# Patient Record
Sex: Female | Born: 1960 | Race: White | Hispanic: Yes | Smoking: Current every day smoker
Health system: Southern US, Community
[De-identification: ages and names within clinical notes are randomized; demographics above are authoritative.]

## PROBLEM LIST (undated history)

## (undated) DIAGNOSIS — F32A Depression, unspecified: Secondary | ICD-10-CM

## (undated) DIAGNOSIS — F329 Major depressive disorder, single episode, unspecified: Secondary | ICD-10-CM

## (undated) DIAGNOSIS — M549 Dorsalgia, unspecified: Secondary | ICD-10-CM

## (undated) DIAGNOSIS — E119 Type 2 diabetes mellitus without complications: Secondary | ICD-10-CM

## (undated) DIAGNOSIS — F419 Anxiety disorder, unspecified: Secondary | ICD-10-CM

## (undated) HISTORY — DX: Dorsalgia, unspecified: M54.9

## (undated) HISTORY — DX: Anxiety disorder, unspecified: F41.9

## (undated) HISTORY — DX: Depression, unspecified: F32.A

## (undated) HISTORY — PX: BLADDER REPAIR: SHX6721

## (undated) HISTORY — PX: NECK SURGERY: SHX720

## (undated) HISTORY — PX: BACK SURGERY: SHX140

## (undated) HISTORY — PX: APPENDECTOMY: SHX54

## (undated) HISTORY — PX: BREAST SURGERY: SHX581

---
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
## 2009-01-05 ENCOUNTER — Ambulatory Visit: Payer: Self-pay | Admitting: Family Medicine

## 2009-05-06 ENCOUNTER — Observation Stay: Payer: Self-pay | Admitting: Internal Medicine

## 2009-07-22 ENCOUNTER — Ambulatory Visit: Payer: Self-pay | Admitting: Neurosurgery

## 2009-09-18 ENCOUNTER — Ambulatory Visit: Payer: Self-pay | Admitting: Pain Medicine

## 2009-11-02 ENCOUNTER — Ambulatory Visit: Payer: Self-pay | Admitting: Pain Medicine

## 2009-11-15 ENCOUNTER — Ambulatory Visit: Payer: Self-pay | Admitting: Pain Medicine

## 2009-11-28 ENCOUNTER — Emergency Department: Payer: Self-pay | Admitting: Emergency Medicine

## 2009-11-28 ENCOUNTER — Ambulatory Visit: Payer: Self-pay | Admitting: Pain Medicine

## 2009-12-27 ENCOUNTER — Ambulatory Visit: Payer: Self-pay | Admitting: Pain Medicine

## 2010-01-02 ENCOUNTER — Ambulatory Visit: Payer: Self-pay | Admitting: Pain Medicine

## 2010-01-18 ENCOUNTER — Ambulatory Visit: Payer: Self-pay | Admitting: Pain Medicine

## 2010-02-01 ENCOUNTER — Ambulatory Visit: Payer: Self-pay | Admitting: Pain Medicine

## 2010-03-09 ENCOUNTER — Ambulatory Visit: Payer: Self-pay | Admitting: Pain Medicine

## 2010-04-26 ENCOUNTER — Observation Stay: Payer: Self-pay | Admitting: Internal Medicine

## 2010-10-16 IMAGING — NM NM MYOCARD GATED
2 series · 12 of 12 positions shown · non-contrast
Comparison: none

REASON FOR EXAM: chest pain
COMMENTS:

PROCEDURE:     NM  - NM GATED MYOCARDIAL ST SCAN [DATE]OF[DATE]  - [DATE] [DATE] [DATE] [DATE]
RESULT:
HISTORY: 49-year-old female with progressive chest pain consistent with
possible myocardial ischemia. The patient underwent a Bruce protocol with an
exercise time of 5 minutes 16 seconds achieving 7 METS, 87% MPHR with a peak
heart rate of 148 beats per minute. Peak systolic blood pressure 164/45,
baseline 82; 106/70. The patient had no chest pain. Baseline EKG shows
normal sinus rhythm with normal EKG. Peak stress shows no
electrocardiographic changes or arrhythmia. The patient received 13.56 mCi
of Sestamibi at rest and 31.38 mCi of Sestamibi peak stress intravenously in
the left antecubital.

[Series 1000: rest mibi · 6.59mm/px · 6 of 64 frames shown]
[frame 6/64]
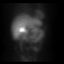
[frame 16/64]
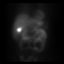
[frame 27/64]
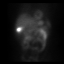
[frame 38/64]
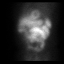
[frame 48/64]
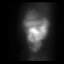
[frame 59/64]
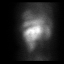

[Series 1000: rest mibi (corrected) · 6.59mm/px · 6 of 64 frames shown]
[frame 6/64]
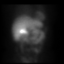
[frame 16/64]
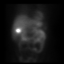
[frame 27/64]
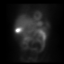
[frame 38/64]
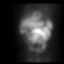
[frame 48/64]
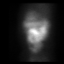
[frame 59/64]
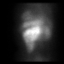

[12 of 12 positions shown; findings below may reference images not displayed]

IMAGE ANALYSIS: Normal LV systolic function with ejection fraction of 70%.
All myocardial walls have normal myocardial function and wall motion. Rest
and peak stress myocardial perfusion images were compared showing normal
myocardial perfusion at rest and peak stress without evidence of myocardial
ischemia.
IMPRESSION: 1.Normal treadmill EKG without evidence of ischemia or arrhythmia.

2.Average exercise tolerance for age.

3.Normal LV systolic function with ejection fraction of 70%.

## 2019-05-24 ENCOUNTER — Other Ambulatory Visit: Payer: Self-pay

## 2019-05-24 ENCOUNTER — Encounter (HOSPITAL_COMMUNITY): Payer: Self-pay | Admitting: Psychiatry

## 2019-05-24 ENCOUNTER — Other Ambulatory Visit (HOSPITAL_COMMUNITY): Payer: Medicare Other | Attending: Psychiatry | Admitting: Psychiatry

## 2019-05-24 ENCOUNTER — Telehealth (HOSPITAL_COMMUNITY): Payer: Self-pay | Admitting: Psychiatry

## 2019-05-24 NOTE — Telephone Encounter (Signed)
D:  Pt referred by Jonelle Sidle (referral coordinator) on 05-21-19; but writer was on PAL.  A:  Placed call to orient patient.  Pt will start MH-IOP tomorrow @ 9 a.m.  Inform Jonelle Sidle.  R:  Pt receptive.
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
## 2019-05-27 ENCOUNTER — Telehealth (HOSPITAL_COMMUNITY): Payer: Self-pay | Admitting: Psychiatry

## 2019-06-04 ENCOUNTER — Encounter: Payer: Self-pay | Admitting: Emergency Medicine

## 2019-06-04 ENCOUNTER — Other Ambulatory Visit: Payer: Self-pay

## 2019-06-04 ENCOUNTER — Emergency Department
Admission: EM | Admit: 2019-06-04 | Discharge: 2019-06-04 | Disposition: A | Discharge status: Home / Self Care | Payer: Medicare Other | Attending: Student in an Organized Health Care Education/Training Program | Admitting: Student in an Organized Health Care Education/Training Program

## 2019-06-04 DIAGNOSIS — E1165 Type 2 diabetes mellitus with hyperglycemia: Secondary | ICD-10-CM | POA: Insufficient documentation

## 2019-06-04 DIAGNOSIS — F17210 Nicotine dependence, cigarettes, uncomplicated: Secondary | ICD-10-CM | POA: Diagnosis not present

## 2019-06-04 DIAGNOSIS — Z7984 Long term (current) use of oral hypoglycemic drugs: Secondary | ICD-10-CM | POA: Insufficient documentation

## 2019-06-04 DIAGNOSIS — Z760 Encounter for issue of repeat prescription: Secondary | ICD-10-CM

## 2019-06-04 DIAGNOSIS — R739 Hyperglycemia, unspecified: Secondary | ICD-10-CM

## 2019-06-04 HISTORY — DX: Type 2 diabetes mellitus without complications: E11.9

## 2019-06-04 LAB — URINALYSIS, COMPLETE (UACMP) WITH MICROSCOPIC
Bacteria, UA: NONE SEEN
Bilirubin Urine: NEGATIVE
Glucose, UA: >=500 mg/dL — AB
Ketones, ur: 20 mg/dL — AB
Nitrite: NEGATIVE
Protein, ur: 30 mg/dL — AB
Specific Gravity, Urine: 1.027 (ref 1.005–1.030)
pH: 5 (ref 5.0–8.0)

## 2019-06-04 LAB — CBC
HCT: 49.7 % — ABNORMAL HIGH (ref 36.0–46.0)
Hemoglobin: 16.1 g/dL — ABNORMAL HIGH (ref 12.0–15.0)
MCH: 26.8 pg (ref 26.0–34.0)
MCHC: 32.4 g/dL (ref 30.0–36.0)
MCV: 82.7 fL (ref 80.0–100.0)
Platelets: 262 10*3/uL (ref 150–400)
RBC: 6.01 MIL/uL — ABNORMAL HIGH (ref 3.87–5.11)
RDW: 13.8 % (ref 11.5–15.5)
WBC: 11.8 10*3/uL — ABNORMAL HIGH (ref 4.0–10.5)
nRBC: 0 % (ref 0.0–0.2)

## 2019-06-04 LAB — BASIC METABOLIC PANEL
Anion gap: 12 (ref 5–15)
BUN: 12 mg/dL (ref 6–20)
CO2: 26 mmol/L (ref 22–32)
Calcium: 9.9 mg/dL (ref 8.9–10.3)
Chloride: 96 mmol/L — ABNORMAL LOW (ref 98–111)
Creatinine, Ser: 0.72 mg/dL (ref 0.44–1.00)
GFR calc Af Amer: >60 mL/min (ref >60)
GFR calc non Af Amer: >60 mL/min (ref >60)
Glucose, Bld: 308 mg/dL — ABNORMAL HIGH (ref 70–99)
Potassium: 4.9 mmol/L (ref 3.5–5.1)
Sodium: 134 mmol/L — ABNORMAL LOW (ref 135–145)

## 2019-06-04 LAB — GLUCOSE, CAPILLARY
Glucose-Capillary: 315 mg/dL — ABNORMAL HIGH (ref 70–99)
Glucose-Capillary: 223 mg/dL — ABNORMAL HIGH (ref 70–99)

## 2019-06-04 MED ORDER — SODIUM CHLORIDE 0.9 % IV BOLUS
1000.0000 mL | Freq: Once | INTRAVENOUS | Status: AC
  Administered 2019-06-04: 14:00:00 1000 mL via INTRAVENOUS

## 2019-06-04 MED ORDER — QUETIAPINE FUMARATE 300 MG PO TABS: 300.0000 mg | ORAL_TABLET | Freq: Every day | ORAL | 0 refills | Status: DC

## 2019-06-04 MED ORDER — CEPHALEXIN 500 MG PO CAPS: 500.0000 mg | ORAL_CAPSULE | Freq: Two times a day (BID) | ORAL | 0 refills | Status: AC

## 2019-06-04 NOTE — ED Triage Notes (Signed)
Pt to ED via POV c/o high blood sugar x 1 week. Pt states that she has been taking her PO diabetic medication but her sugar is still running high. Pt states that this morning her CBG was 418. Pt is in NAD.

## 2019-06-04 NOTE — Discharge Instructions (Signed)
Please follow-up with the primary care provider as scheduled.  Take the antibiotic until finished.  Return to the emergency department for symptoms of concern if you are unable to see her primary care provider sooner.

## 2019-06-04 NOTE — ED Provider Notes (Signed)
[facility] Emergency Department Provider Note ____________________________________________   First MD Initiated Contact with Patient 06/04/19 1302     (approximate)  I have reviewed the triage vital signs and the nursing notes.   HISTORY  Chief Complaint Hyperglycemia  HPI Beatriz ChancellorFrancisca Mackowski is a 58 y.o. female who presents to the emergency department for treatment and evaluation of hyperglycemia.  Patient states that she has taken her Januvia and has been compliant with her diabetic diet, however over the past week or so she has been unable to control her blood sugar.  She denies any symptoms of concern.  She does state that she recently moved to the area and her first appointment with primary care is June 17, 2019.  She is out of several of her medications, but not out of the Januvia.  She denies any nausea, vomiting, diarrhea, fever, headache, or other symptoms of concern.        Past Medical History:  Diagnosis Date  . Anxiety   . Depression   . Diabetes mellitus without complication (HCC)     There are no active problems to display for this patient.   History reviewed. No pertinent surgical history.  Prior to Admission medications   Medication Sig Start Date End Date Taking? Authorizing Provider  cephALEXin (KEFLEX) 500 MG capsule Take 1 capsule (500 mg total) by mouth 2 (two) times daily for 7 days. 06/04/19 06/11/19  Kenslei Hearty, Rulon Eisenmengerari B, FNP  QUEtiapine (SEROQUEL) 300 MG tablet Take 1 tablet (300 mg total) by mouth at bedtime. 06/04/19   Chinita Pesterriplett, Miria Cappelli B, FNP    Allergies Patient has no known allergies.  Family History  Problem Relation Age of Onset  . Depression Sister     Social History Social History   Tobacco Use  . Smoking status: Current Every Day Smoker    Packs/day: 0.50    Types: Cigarettes  . Smokeless tobacco: Never Used  Substance Use Topics  . Alcohol use: Not Currently  . Drug use: Yes    Frequency: 4.0 times per week     Types: Marijuana    Comment: "to help relax or for pain."    Review of Systems  Constitutional: No fever/chills Eyes: No visual changes. ENT: No sore throat. Cardiovascular: Denies chest pain. Respiratory: Denies shortness of breath. Gastrointestinal: No abdominal pain.  No nausea, no vomiting.  No diarrhea.  No constipation. Genitourinary: Negative for dysuria. Musculoskeletal: Negative for back pain. Skin: Negative for rash. Neurological: Negative for headaches, focal weakness or numbness. Endocrine:  Positive for polyuria and polydipsia ____________________________________________   PHYSICAL EXAM:  VITAL SIGNS: ED Triage Vitals  Enc Vitals Group     BP 06/04/19 1147 139/81     Pulse Rate 06/04/19 1147 89     Resp 06/04/19 1147 16     Temp 06/04/19 1147 98.4 F (36.9 C)     Temp Source 06/04/19 1147 Oral     SpO2 06/04/19 1147 100 %     Weight 06/04/19 1144 140 lb (63.5 kg)     Height 06/04/19 1144 5\' 1"  (1.549 m)     Head Circumference --      Peak Flow --      Pain Score 06/04/19 1144 0     Pain Loc --      Pain Edu? --      Excl. in GC? --     Constitutional: Alert and oriented. Well appearing and in no acute distress. Eyes: Conjunctivae are normal. PERRL.  EOMI. Head: Atraumatic. Nose: No congestion/rhinnorhea. Mouth/Throat: Mucous membranes are moist.  Oropharynx non-erythematous. Neck: No stridor.   Hematological/Lymphatic/Immunilogical: No cervical lymphadenopathy. Cardiovascular: Normal rate, regular rhythm. Grossly normal heart sounds.  Good peripheral circulation. Respiratory: Normal respiratory effort.  No retractions. Lungs CTAB. Gastrointestinal: Soft and nontender. No distention. No abdominal bruits. No CVA tenderness. Genitourinary:  Musculoskeletal: No lower extremity tenderness nor edema.  No joint effusions. Neurologic:  Normal speech and language. No gross focal neurologic deficits are appreciated. No gait instability. Skin:  Skin is  warm, dry and intact. No rash noted. Psychiatric: Mood and affect are normal. Speech and behavior are normal.  ____________________________________________   LABS (all labs ordered are listed, but only abnormal results are displayed)  Labs Reviewed  BASIC METABOLIC PANEL - Abnormal; Notable for the following components:      Result Value   Sodium 134 (*)    Chloride 96 (*)    Glucose, Bld 308 (*)    All other components within normal limits  CBC - Abnormal; Notable for the following components:   WBC 11.8 (*)    RBC 6.01 (*)    Hemoglobin 16.1 (*)    HCT 49.7 (*)    All other components within normal limits  URINALYSIS, COMPLETE (UACMP) WITH MICROSCOPIC - Abnormal; Notable for the following components:   Color, Urine YELLOW (*)    APPearance HAZY (*)    Glucose, UA >=500 (*)    Hgb urine dipstick SMALL (*)    Ketones, ur 20 (*)    Protein, ur 30 (*)    Leukocytes,Ua SMALL (*)    All other components within normal limits  GLUCOSE, CAPILLARY - Abnormal; Notable for the following components:   Glucose-Capillary 315 (*)    All other components within normal limits  GLUCOSE, CAPILLARY - Abnormal; Notable for the following components:   Glucose-Capillary 223 (*)    All other components within normal limits  CBG MONITORING, ED  CBG MONITORING, ED  CBG MONITORING, ED  CBG MONITORING, ED  CBG MONITORING, ED  CBG MONITORING, ED  CBG MONITORING, ED  CBG MONITORING, ED  CBG MONITORING, ED  CBG MONITORING, ED  CBG MONITORING, ED  CBG MONITORING, ED  CBG MONITORING, ED  CBG MONITORING, ED   ____________________________________________  EKG  Not indicated ____________________________________________  RADIOLOGY  ED MD interpretation: Not indicated  Official radiology report(s): No results found.  ____________________________________________   PROCEDURES  Procedure(s) performed (including Critical  Care):  Procedures   ____________________________________________   INITIAL IMPRESSION / ASSESSMENT AND PLAN   As part of my medical decision making, I reviewed the following data within the electronic MEDICAL RECORD NUMBER Labs reviewed      58 year old female presenting to the emergency department for assistance in regulating her glucose.  She does seem to be compliant with her medications.  Plan will be to ensure that there is no underlying infection or other obvious cause for hyperglycemia.  She denies any symptoms consistent with the COVID-19 pandemic.  She denies any known exposures.   DIFFERENTIAL DIAGNOSIS  Hyperglycemia, DKA  ED COURSE  While in the emergency department, the patient was given 1 L of IV fluids with successful reduction of her blood sugar down to 223.  Urinalysis does appear to have significant amount of white blood cells and small amount of leukocytes.  She is aware that she will need to keep her appointment scheduled for the 13th.  She is also aware that she will need to continue her  diabetic diet and take her Januvia.  Patient requested that she have a prescription for Seroquel.  This is 1 of the medications that she has been out of for the past few days and states that she is not getting any sleep at night.  Plan will be to give her prescription for 14 days.  She was aware that the emergency department does not routinely refill medications, however because she seems compliant with her medications and does have an appointment scheduled for the 13th feels reasonable to give her 2 weeks.  She states that she just got her Januvia refilled and does not need another at this time.    ____________________________________________   FINAL CLINICAL IMPRESSION(S) / ED DIAGNOSES  Final diagnoses:  Hyperglycemia  Medication refill     ED Discharge Orders         Ordered    cephALEXin (KEFLEX) 500 MG capsule  2 times daily     06/04/19 1453    QUEtiapine (SEROQUEL) 300  MG tablet  Daily at bedtime     06/04/19 1453           Note:  This document was prepared using Dragon voice recognition software and may include unintentional dictation errors.   Victorino Dike, FNP 06/04/19 1455    Merlyn Lot, MD 06/04/19 [phone]

## 2019-06-08 ENCOUNTER — Encounter: Payer: Self-pay | Admitting: Emergency Medicine

## 2019-06-08 ENCOUNTER — Other Ambulatory Visit: Payer: Self-pay

## 2019-06-08 ENCOUNTER — Emergency Department
Admission: EM | Admit: 2019-06-08 | Discharge: 2019-06-08 | Disposition: A | Discharge status: Home / Self Care | Payer: Medicare Other | Attending: Emergency Medicine | Admitting: Emergency Medicine

## 2019-06-08 DIAGNOSIS — F17210 Nicotine dependence, cigarettes, uncomplicated: Secondary | ICD-10-CM | POA: Insufficient documentation

## 2019-06-08 DIAGNOSIS — R739 Hyperglycemia, unspecified: Secondary | ICD-10-CM

## 2019-06-08 DIAGNOSIS — E1165 Type 2 diabetes mellitus with hyperglycemia: Secondary | ICD-10-CM | POA: Insufficient documentation

## 2019-06-08 LAB — URINALYSIS, COMPLETE (UACMP) WITH MICROSCOPIC
Bacteria, UA: NONE SEEN
Bilirubin Urine: NEGATIVE
Glucose, UA: >=500 mg/dL — AB
Ketones, ur: 5 mg/dL — AB
Leukocytes,Ua: NEGATIVE
Nitrite: NEGATIVE
Protein, ur: NEGATIVE mg/dL
Specific Gravity, Urine: 1.025 (ref 1.005–1.030)
pH: 6 (ref 5.0–8.0)

## 2019-06-08 LAB — BASIC METABOLIC PANEL
Anion gap: 11 (ref 5–15)
BUN: 17 mg/dL (ref 6–20)
CO2: 27 mmol/L (ref 22–32)
Calcium: 9.5 mg/dL (ref 8.9–10.3)
Chloride: 95 mmol/L — ABNORMAL LOW (ref 98–111)
Creatinine, Ser: 1.06 mg/dL — ABNORMAL HIGH (ref 0.44–1.00)
GFR calc Af Amer: >60 mL/min (ref >60)
GFR calc non Af Amer: 57 mL/min — ABNORMAL LOW (ref >60)
Glucose, Bld: 414 mg/dL — ABNORMAL HIGH (ref 70–99)
Potassium: 4.5 mmol/L (ref 3.5–5.1)
Sodium: 133 mmol/L — ABNORMAL LOW (ref 135–145)

## 2019-06-08 LAB — GLUCOSE, CAPILLARY
Glucose-Capillary: 401 mg/dL — ABNORMAL HIGH (ref 70–99)
Glucose-Capillary: 244 mg/dL — ABNORMAL HIGH (ref 70–99)

## 2019-06-08 LAB — CBC
HCT: 45.5 % (ref 36.0–46.0)
Hemoglobin: 14.6 g/dL (ref 12.0–15.0)
MCH: 26.8 pg (ref 26.0–34.0)
MCHC: 32.1 g/dL (ref 30.0–36.0)
MCV: 83.6 fL (ref 80.0–100.0)
Platelets: 248 10*3/uL (ref 150–400)
RBC: 5.44 MIL/uL — ABNORMAL HIGH (ref 3.87–5.11)
RDW: 13.8 % (ref 11.5–15.5)
WBC: 10.6 10*3/uL — ABNORMAL HIGH (ref 4.0–10.5)
nRBC: 0 % (ref 0.0–0.2)

## 2019-06-08 MED ORDER — SODIUM CHLORIDE 0.9 % IV BOLUS: 1000.0000 mL | Freq: Once | INTRAVENOUS | Status: DC

## 2019-06-08 NOTE — ED Provider Notes (Signed)
[facility] Emergency Department Provider Note  ____________________________________________   First MD Initiated Contact with Patient 06/08/19 1629     (approximate)  I have reviewed the triage vital signs and the nursing notes.   HISTORY  Chief Complaint Hyperglycemia    HPI Peggy Massey is a 58 y.o. female with diabetes who presents with hyperglycemia.    Patient was seen on 7/31 for hyperglycemia as well.  Patient was taking her Januvia is been compliant with her diabetic diet.  She recently moved to the area and her primary care appointment is on June 17, 2019 Patient's glucose was noted to be in the 300s and was given 1 L of fluids with brought her sugar down to 223.  Patient returns because her sugars have been in the 300s to 400s.  She has no symptoms.  She hypoglycemia has been intermittent, nothing makes it better or worse, onset a few days ago.    Past Medical History:  Diagnosis Date  . Anxiety   . Depression   . Diabetes mellitus without complication (HCC)     There are no active problems to display for this patient.   History reviewed. No pertinent surgical history.  Prior to Admission medications   Medication Sig Start Date End Date Taking? Authorizing Provider  cephALEXin (KEFLEX) 500 MG capsule Take 1 capsule (500 mg total) by mouth 2 (two) times daily for 7 days. 06/04/19 06/11/19  Triplett, Rulon Eisenmengerari B, FNP  QUEtiapine (SEROQUEL) 300 MG tablet Take 1 tablet (300 mg total) by mouth at bedtime. 06/04/19   Chinita Pesterriplett, Cari B, FNP    Allergies Patient has no known allergies.  Family History  Problem Relation Age of Onset  . Depression Sister     Social History Social History   Tobacco Use  . Smoking status: Current Every Day Smoker    Packs/day: 0.50    Types: Cigarettes  . Smokeless tobacco: Never Used  Substance Use Topics  . Alcohol use: Not Currently  . Drug use: Yes    Frequency: 4.0 times per week    Types:  Marijuana    Comment: "to help relax or for pain."      Review of Systems Constitutional: No fever/chills, positive for high sugars Eyes: No visual changes. ENT: No sore throat. Cardiovascular: Denies chest pain. Respiratory: Denies shortness of breath. Gastrointestinal: No abdominal pain.  No nausea, no vomiting.  No diarrhea.  No constipation. Genitourinary: Negative for dysuria. Musculoskeletal: Negative for back pain. Skin: Negative for rash. Neurological: Negative for headaches, focal weakness or numbness. All other ROS negative ____________________________________________   PHYSICAL EXAM:  VITAL SIGNS: ED Triage Vitals  Enc Vitals Group     BP 06/08/19 1604 130/69     Pulse Rate 06/08/19 1604 94     Resp 06/08/19 1604 16     Temp 06/08/19 1604 99.1 F (37.3 C)     Temp Source 06/08/19 1604 Oral     SpO2 06/08/19 1604 96 %     Weight 06/08/19 1602 139 lb 15.9 oz (63.5 kg)     Height --      Head Circumference --      Peak Flow --      Pain Score 06/08/19 1602 0     Pain Loc --      Pain Edu? --      Excl. in GC? --     Constitutional: Alert and oriented. Well appearing and in no acute distress. Eyes: Conjunctivae are  normal. EOMI. Head: Atraumatic. Nose: No congestion/rhinnorhea. Mouth/Throat: Mucous membranes are moist.   Neck: No stridor. Trachea Midline. FROM Cardiovascular: Normal rate, regular rhythm. Grossly normal heart sounds.  Good peripheral circulation. Respiratory: Normal respiratory effort.  No retractions. Lungs CTAB. Gastrointestinal: Soft and nontender. No distention. No abdominal bruits.  Musculoskeletal: No lower extremity tenderness nor edema.  No joint effusions. Neurologic:  Normal speech and language. No gross focal neurologic deficits are appreciated.  Skin:  Skin is warm, dry and intact. No rash noted. Psychiatric: Mood and affect are normal. Speech and behavior are normal. GU: Deferred    ____________________________________________   LABS (all labs ordered are listed, but only abnormal results are displayed)  Labs Reviewed  BASIC METABOLIC PANEL - Abnormal; Notable for the following components:      Result Value   Sodium 133 (*)    Chloride 95 (*)    Glucose, Bld 414 (*)    Creatinine, Ser 1.06 (*)    GFR calc non Af Amer 57 (*)    All other components within normal limits  CBC - Abnormal; Notable for the following components:   WBC 10.6 (*)    RBC 5.44 (*)    All other components within normal limits  URINALYSIS, COMPLETE (UACMP) WITH MICROSCOPIC - Abnormal; Notable for the following components:   Color, Urine YELLOW (*)    APPearance CLEAR (*)    Glucose, UA >=500 (*)    Hgb urine dipstick SMALL (*)    Ketones, ur 5 (*)    All other components within normal limits  GLUCOSE, CAPILLARY - Abnormal; Notable for the following components:   Glucose-Capillary 401 (*)    All other components within normal limits  GLUCOSE, CAPILLARY - Abnormal; Notable for the following components:   Glucose-Capillary 244 (*)    All other components within normal limits  CBG MONITORING, ED  CBG MONITORING, ED   ____________________________________________   PROCEDURES  Procedure(s) performed (including Critical Care):  Procedures   ____________________________________________   INITIAL IMPRESSION / ASSESSMENT AND PLAN / ED COURSE  Peggy Massey was evaluated in Emergency Department on 06/08/2019 for the symptoms described in the history of present illness. She was evaluated in the context of the global COVID-19 pandemic, which necessitated consideration that the patient might be at risk for infection with the SARS-CoV-2 virus that causes COVID-19. Institutional protocols and algorithms that pertain to the evaluation of patients at risk for COVID-19 are in a state of rapid change based on information released by regulatory bodies including the CDC and federal and state  organizations. These policies and algorithms were followed during the patient's care in the ED.    Patient is a well-appearing 58 year old who presents with hyperglycemia from known type 2 diabetes.  Patient is already on medication.  She is not able to take metformin.  Patient will most likely need be started on another agent versus being started on insulin.  I discussed with patient that sugars in the 300s to 400s although not ideal as long she is not having symptoms to suggest DKA that this can be safely followed up outpatient with her primary care doctor.  However given that she is now bounced back to the emergency department with her high sugars I did offer admission overnight to be started on insulin.  Patient would prefer to go home and follow-up outpatient.  She has no symptoms to suggest DKA.  No symptoms to suggest UTI, pneumonia.  Her abdomen is soft and nontender.  UA with 5 ketones in it.  Glucose elevated at 401.  Creatinine is slightly elevated at 1.06 without anion gap elevation.  White count is slightly downtrending from 4 days ago at 10.6.   Patient was given 2 L of fluid and repeat sugar was 244.  Patient continues to be asymptomatic.  Again discussed with patient about admission versus discharge home and she would prefer to go home.  Patient will call her PCP to try to get earlier follow-up otherwise she has follow-up within the week.  I discussed the provisional nature of ED diagnosis, the treatment so far, the ongoing plan of care, follow up appointments and return precautions with the patient and any family or support people present. They expressed understanding and agreed with the plan, discharged home.       ____________________________________________   FINAL CLINICAL IMPRESSION(S) / ED DIAGNOSES   Final diagnoses:  Hyperglycemia      MEDICATIONS GIVEN DURING THIS VISIT:  Medications  sodium chloride 0.9 % bolus 1,000 mL (1,000 mLs Intravenous New Bag/Given  06/08/19 1721)  sodium chloride 0.9 % bolus 1,000 mL (1,000 mLs Intravenous New Bag/Given 06/08/19 1706)     ED Discharge Orders    None       Note:  This document was prepared using Dragon voice recognition software and may include unintentional dictation errors.   Concha SeFunke, Chantilly Linskey E, MD 06/08/19 2011

## 2019-06-08 NOTE — Discharge Instructions (Signed)
Your sugar was elevated consistent with your type 2 diabetes.  You should call your doctor tomorrow to get an earlier visit .however if you develop symptoms of DKA which are vomiting, abdominal pain, weakness, that you should return immediately to the ER.

## 2019-06-08 NOTE — ED Notes (Signed)
Pt c/o hyperglycemia. Pt says she is taking Januvia. Pt c/o of heaviness head pain and slightly weak.

## 2019-06-08 NOTE — ED Triage Notes (Signed)
C/O hyperglycemia.  STates since being seen through ED last week, CBG running 400-500.  Patient is AAOx3.  Skin warm and dry. NAD

## 2019-06-17 ENCOUNTER — Other Ambulatory Visit: Payer: Self-pay

## 2019-06-17 ENCOUNTER — Other Ambulatory Visit: Payer: Self-pay | Admitting: Family Medicine

## 2019-06-17 ENCOUNTER — Ambulatory Visit (INDEPENDENT_AMBULATORY_CARE_PROVIDER_SITE_OTHER): Payer: Medicare Other | Admitting: Family Medicine

## 2019-06-17 ENCOUNTER — Encounter: Payer: Self-pay | Admitting: Family Medicine

## 2019-06-17 VITALS — BP 124/72 | HR 89 | Temp 97.1°F | Resp 14 | Ht 61.0 in | Wt 136.1 lb

## 2019-06-17 DIAGNOSIS — Z1159 Encounter for screening for other viral diseases: Secondary | ICD-10-CM

## 2019-06-17 DIAGNOSIS — Z114 Encounter for screening for human immunodeficiency virus [HIV]: Secondary | ICD-10-CM

## 2019-06-17 DIAGNOSIS — Z862 Personal history of diseases of the blood and blood-forming organs and certain disorders involving the immune mechanism: Secondary | ICD-10-CM

## 2019-06-17 DIAGNOSIS — E119 Type 2 diabetes mellitus without complications: Secondary | ICD-10-CM | POA: Diagnosis not present

## 2019-06-17 DIAGNOSIS — F319 Bipolar disorder, unspecified: Secondary | ICD-10-CM

## 2019-06-17 DIAGNOSIS — R5383 Other fatigue: Secondary | ICD-10-CM

## 2019-06-17 DIAGNOSIS — Z1231 Encounter for screening mammogram for malignant neoplasm of breast: Secondary | ICD-10-CM

## 2019-06-17 DIAGNOSIS — F419 Anxiety disorder, unspecified: Secondary | ICD-10-CM | POA: Diagnosis not present

## 2019-06-17 DIAGNOSIS — M549 Dorsalgia, unspecified: Secondary | ICD-10-CM

## 2019-06-17 DIAGNOSIS — F32A Depression, unspecified: Secondary | ICD-10-CM | POA: Insufficient documentation

## 2019-06-17 DIAGNOSIS — Z23 Encounter for immunization: Secondary | ICD-10-CM | POA: Diagnosis not present

## 2019-06-17 DIAGNOSIS — F329 Major depressive disorder, single episode, unspecified: Secondary | ICD-10-CM | POA: Insufficient documentation

## 2019-06-17 DIAGNOSIS — E559 Vitamin D deficiency, unspecified: Secondary | ICD-10-CM

## 2019-06-17 DIAGNOSIS — Z8742 Personal history of other diseases of the female genital tract: Secondary | ICD-10-CM

## 2019-06-17 DIAGNOSIS — G8929 Other chronic pain: Secondary | ICD-10-CM

## 2019-06-17 DIAGNOSIS — Z13220 Encounter for screening for lipoid disorders: Secondary | ICD-10-CM

## 2019-06-17 DIAGNOSIS — Z8639 Personal history of other endocrine, nutritional and metabolic disease: Secondary | ICD-10-CM | POA: Insufficient documentation

## 2019-06-17 LAB — POCT GLYCOSYLATED HEMOGLOBIN (HGB A1C): HbA1c, POC (controlled diabetic range): 13 % — AB (ref 0.0–7.0)

## 2019-06-17 MED ORDER — TOUJEO MAX SOLOSTAR 300 UNIT/ML ~~LOC~~ SOPN: 12.0000 [IU] | PEN_INJECTOR | Freq: Every day | SUBCUTANEOUS | 0 refills | Status: DC

## 2019-06-17 MED ORDER — QUETIAPINE FUMARATE 300 MG PO TABS: 300.0000 mg | ORAL_TABLET | Freq: Every day | ORAL | 1 refills | Status: DC

## 2019-06-17 MED ORDER — BLOOD GLUCOSE MONITOR KIT: PACK | 0 refills | Status: AC

## 2019-06-17 MED ORDER — HYDROXYZINE HCL 50 MG PO TABS: 50.0000 mg | ORAL_TABLET | Freq: Three times a day (TID) | ORAL | 1 refills | Status: DC | PRN

## 2019-06-17 MED ORDER — TRAZODONE HCL 50 MG PO TABS: ORAL_TABLET | ORAL | 1 refills | Status: DC

## 2019-06-17 MED ORDER — OZEMPIC (0.25 OR 0.5 MG/DOSE) 2 MG/1.5ML ~~LOC~~ SOPN: PEN_INJECTOR | SUBCUTANEOUS | 3 refills | Status: DC

## 2019-06-17 MED ORDER — NOVOFINE 32G X 6 MM MISC: 1.0000 | Freq: Every day | 3 refills | Status: AC

## 2019-06-17 MED ORDER — OMEPRAZOLE 40 MG PO CPDR: 40.0000 mg | DELAYED_RELEASE_CAPSULE | Freq: Every day | ORAL | 1 refills | Status: AC

## 2019-06-17 MED ORDER — DULOXETINE HCL 30 MG PO CPEP: ORAL_CAPSULE | ORAL | 1 refills | Status: DC

## 2019-06-17 NOTE — Patient Instructions (Signed)
Goal Blood Sugar is 100-140 (fasting, first thing in the morning before eating) STOP Januvia.  Toujeo: Days 1, 2, & 3: Take 12 units once daily at bedtime If blood sugar is still >140, increase to 14 units for 3 days. If blood sugar is still >140, increase to 16 units for 3 days. IF blood sugar is still >140, increase to 18 units for 3 days If blood sugar is still >140, increase to 20 units for 3 days.  Ozempic: Inject 0.25mg  once a week for 4 weeks. Then increase to 0.5mg  once a week.
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
## 2019-06-18 LAB — COMPLETE METABOLIC PANEL WITH GFR
AG Ratio: 1.5 (calc) (ref 1.0–2.5)
ALT: 28 U/L (ref 6–29)
AST: 18 U/L (ref 10–35)
Albumin: 4.9 g/dL (ref 3.6–5.1)
Alkaline phosphatase (APISO): 140 U/L (ref 37–153)
BUN: 13 mg/dL (ref 7–25)
CO2: 29 mmol/L (ref 20–32)
Calcium: 10.7 mg/dL — ABNORMAL HIGH (ref 8.6–10.4)
Chloride: 95 mmol/L — ABNORMAL LOW (ref 98–110)
Creat: 0.7 mg/dL (ref 0.50–1.05)
GFR, Est African American: 110 mL/min/{1.73_m2} (ref >=60)
GFR, Est Non African American: 95 mL/min/{1.73_m2} (ref >=60)
Globulin: 3.2 g/dL (calc) (ref 1.9–3.7)
Glucose, Bld: 341 mg/dL — ABNORMAL HIGH (ref 65–99)
Potassium: 4.8 mmol/L (ref 3.5–5.3)
Sodium: 135 mmol/L (ref 135–146)
Total Bilirubin: 0.4 mg/dL (ref 0.2–1.2)
Total Protein: 8.1 g/dL (ref 6.1–8.1)

## 2019-06-18 LAB — CBC WITH DIFFERENTIAL/PLATELET
Absolute Monocytes: 491 cells/uL (ref 200–950)
Basophils Absolute: 64 cells/uL (ref 0–200)
Basophils Relative: 0.7 %
Eosinophils Absolute: 118 cells/uL (ref 15–500)
Eosinophils Relative: 1.3 %
HCT: 48.2 % — ABNORMAL HIGH (ref 35.0–45.0)
Hemoglobin: 15.4 g/dL (ref 11.7–15.5)
Lymphs Abs: 3331 cells/uL (ref 850–3900)
MCH: 26.6 pg — ABNORMAL LOW (ref 27.0–33.0)
MCHC: 32 g/dL (ref 32.0–36.0)
MCV: 83.2 fL (ref 80.0–100.0)
MPV: 11.6 fL (ref 7.5–12.5)
Monocytes Relative: 5.4 %
Neutro Abs: 5096 cells/uL (ref 1500–7800)
Neutrophils Relative %: 56 %
Platelets: 269 10*3/uL (ref 140–400)
RBC: 5.79 10*6/uL — ABNORMAL HIGH (ref 3.80–5.10)
RDW: 13 % (ref 11.0–15.0)
Total Lymphocyte: 36.6 %
WBC: 9.1 10*3/uL (ref 3.8–10.8)

## 2019-06-18 LAB — MICROALBUMIN / CREATININE URINE RATIO
Creatinine, Urine: 46 mg/dL (ref 20–275)
Microalb Creat Ratio: 54 mcg/mg creat — ABNORMAL HIGH (ref <30)
Microalb, Ur: 2.5 mg/dL

## 2019-06-18 LAB — TSH: TSH: 1.08 mIU/L (ref 0.40–4.50)

## 2019-06-18 LAB — LIPID PANEL
Cholesterol: 259 mg/dL — ABNORMAL HIGH (ref <200)
HDL: 38 mg/dL — ABNORMAL LOW (ref >=50)
LDL Cholesterol (Calc): 159 mg/dL (calc) — ABNORMAL HIGH
Non-HDL Cholesterol (Calc): 221 mg/dL (calc) — ABNORMAL HIGH (ref <130)
Total CHOL/HDL Ratio: 6.8 (calc) — ABNORMAL HIGH (ref <5.0)
Triglycerides: 364 mg/dL — ABNORMAL HIGH (ref <150)

## 2019-06-18 LAB — HIV ANTIBODY (ROUTINE TESTING W REFLEX): HIV 1&2 Ab, 4th Generation: NONREACTIVE

## 2019-06-18 LAB — HEPATITIS C ANTIBODY
Hepatitis C Ab: NONREACTIVE
SIGNAL TO CUT-OFF: 0.01 (ref <1.00)

## 2019-06-18 LAB — VITAMIN D 25 HYDROXY (VIT D DEFICIENCY, FRACTURES): Vit D, 25-Hydroxy: 26 ng/mL — ABNORMAL LOW (ref 30–100)

## 2019-06-22 ENCOUNTER — Other Ambulatory Visit: Payer: Self-pay | Admitting: Family Medicine

## 2019-06-22 DIAGNOSIS — E1169 Type 2 diabetes mellitus with other specified complication: Secondary | ICD-10-CM

## 2019-06-22 DIAGNOSIS — E1129 Type 2 diabetes mellitus with other diabetic kidney complication: Secondary | ICD-10-CM | POA: Insufficient documentation

## 2019-06-22 DIAGNOSIS — E785 Hyperlipidemia, unspecified: Secondary | ICD-10-CM | POA: Insufficient documentation

## 2019-06-22 DIAGNOSIS — Z794 Long term (current) use of insulin: Secondary | ICD-10-CM

## 2019-06-22 MED ORDER — ATORVASTATIN CALCIUM 40 MG PO TABS: 40.0000 mg | ORAL_TABLET | Freq: Every day | ORAL | 3 refills | Status: AC

## 2019-06-22 MED ORDER — ICOSAPENT ETHYL 1 G PO CAPS: 2.0000 | ORAL_CAPSULE | Freq: Two times a day (BID) | ORAL | 1 refills | Status: AC

## 2019-06-22 MED ORDER — LISINOPRIL 5 MG PO TABS: 5.0000 mg | ORAL_TABLET | Freq: Every day | ORAL | 3 refills | Status: AC

## 2019-06-23 ENCOUNTER — Telehealth: Payer: Self-pay | Admitting: Family Medicine

## 2019-06-23 DIAGNOSIS — R112 Nausea with vomiting, unspecified: Secondary | ICD-10-CM

## 2019-06-23 NOTE — Telephone Encounter (Signed)
Pt called stating the insulin she was prescribed is making her vomit. Pt is requesting an alternate medication be sent in. Please advise.   [id number] [address]  [place] [address]  Phone: [phone] Fax: [phone]  Not a 24 hour pharmacy; exact hours not known.

## 2019-06-24 ENCOUNTER — Encounter: Payer: Self-pay | Admitting: Obstetrics and Gynecology

## 2019-06-24 ENCOUNTER — Other Ambulatory Visit: Payer: Self-pay

## 2019-06-24 ENCOUNTER — Ambulatory Visit (INDEPENDENT_AMBULATORY_CARE_PROVIDER_SITE_OTHER): Payer: Medicare Other | Admitting: Obstetrics and Gynecology

## 2019-06-24 VITALS — BP 118/76 | HR 86 | Ht 61.0 in | Wt 137.4 lb

## 2019-06-24 DIAGNOSIS — R87619 Unspecified abnormal cytological findings in specimens from cervix uteri: Secondary | ICD-10-CM

## 2019-06-24 MED ORDER — ONDANSETRON HCL 4 MG PO TABS: 4.0000 mg | ORAL_TABLET | Freq: Three times a day (TID) | ORAL | 0 refills | Status: DC | PRN

## 2019-06-24 NOTE — Telephone Encounter (Signed)
Patient notified

## 2019-06-24 NOTE — Telephone Encounter (Signed)
No alternative.  Please have her STOP the Ozempic injection for 14 days to see if this improves her symptoms.  Continue with the Toujeo (insulin).  I am sending in some zofran to help her nausea.  If things worsen, she will need to be seen.

## 2019-06-24 NOTE — Progress Notes (Signed)
HPI:      [name] is a 58 y.o. No obstetric history on file. who LMP was No LMP recorded. Patient is postmenopausal.  Subjective:   She presents today as a referral from [facility].  She is a relatively poor historian but does state that she had an abnormal Pap smear and underwent cryotherapy of her cervix for this problem.  She denies previous colposcopy.  She states that she never had follow-up after this procedure.  She reports no problems at this time. (We have no records from these visits in [place])    Hx: The following portions of the patient's history were reviewed and updated as appropriate:             She  has a past medical history of Anxiety, Back ache, Depression, and Diabetes mellitus without complication ([place]). She does not have any pertinent problems on file. She  has a past surgical history that includes Back surgery; Bladder repair; Appendectomy; Breast surgery; Neck surgery; and Cesarean section. Her family history includes Depression in her sister. She  reports that she has been smoking cigarettes. She has been smoking about 0.50 packs per day. She has never used smokeless tobacco. She reports previous alcohol use. She reports current drug use. Frequency: 4.00 times per week. Drug: Marijuana. She has a current medication list which includes the following prescription(s): accu-chek fastclix lancets, atorvastatin, blood glucose meter kit and supplies, duloxetine, hydroxyzine, toujeo max solostar, novofine, omeprazole, quetiapine, trazodone, icosapent ethyl, lisinopril, and ondansetron. She has No Known Allergies.       Review of Systems:  Review of Systems  Constitutional: Denied constitutional symptoms, night sweats, recent illness, fatigue, fever, insomnia and weight loss.  Eyes: Denied eye symptoms, eye pain, photophobia, vision change and visual disturbance.  Ears/Nose/Throat/Neck: Denied ear, nose, throat or neck symptoms, hearing loss, nasal  discharge, sinus congestion and sore throat.  Cardiovascular: Denied cardiovascular symptoms, arrhythmia, chest pain/pressure, edema, exercise intolerance, orthopnea and palpitations.  Respiratory: Denied pulmonary symptoms, asthma, pleuritic pain, productive sputum, cough, dyspnea and wheezing.  Gastrointestinal: Denied, gastro-esophageal reflux, melena, nausea and vomiting.  Genitourinary: See HPI for additional information.  Musculoskeletal: Denied musculoskeletal symptoms, stiffness, swelling, muscle weakness and myalgia.  Dermatologic: Denied dermatology symptoms, rash and scar.  Neurologic: Denied neurology symptoms, dizziness, headache, neck pain and syncope.  Psychiatric: Denied psychiatric symptoms, anxiety and depression.  Endocrine: Denied endocrine symptoms including hot flashes and night sweats.   Meds:   Current Outpatient Medications on File Prior to Visit  Medication Sig Dispense Refill  . Accu-Chek FastClix Lancets MISC USE UP TO FOUR TIMES DAILY AS DIRECTED 306 each 3  . atorvastatin (LIPITOR) 40 MG tablet Take 1 tablet (40 mg total) by mouth daily. **Note dose change** 90 tablet 3  . blood glucose meter kit and supplies KIT Dispense based on patient and insurance preference. Use up to four times daily as directed. (FOR ICD-9 250.00, 250.01). 1 each 0  . DULoxetine (CYMBALTA) 30 MG capsule TK ONE C PO QD 30 capsule 1  . hydrOXYzine (ATARAX/VISTARIL) 50 MG tablet Take 1 tablet (50 mg total) by mouth 3 (three) times daily as needed. for anxiety 30 tablet 1  . Insulin Glargine, 2 Unit Dial, (TOUJEO MAX SOLOSTAR) 300 UNIT/ML SOPN Inject 12-20 Units into the skin at bedtime. 9 mL 0  . Insulin Pen Needle (NOVOFINE) 32G X 6 MM MISC 1 each by Does not apply route daily. 100 each 3  . omeprazole (PRILOSEC) 40 MG capsule Take  1 capsule (40 mg total) by mouth daily. 90 capsule 1  . QUEtiapine (SEROQUEL) 300 MG tablet Take 1 tablet (300 mg total) by mouth at bedtime. 30 tablet 1  .  traZODone (DESYREL) 50 MG tablet TK 1 T PO  QHS 30 tablet 1  . Icosapent Ethyl 1 g CAPS Take 2 capsules (2 g total) by mouth 2 (two) times daily. (Patient not taking: Reported on 06/24/2019) 360 capsule 1  . lisinopril (ZESTRIL) 5 MG tablet Take 1 tablet (5 mg total) by mouth daily. (Patient not taking: Reported on 06/24/2019) 90 tablet 3  . ondansetron (ZOFRAN) 4 MG tablet Take 1 tablet (4 mg total) by mouth every 8 (eight) hours as needed for nausea or vomiting. (Patient not taking: Reported on 06/24/2019) 20 tablet 0   No current facility-administered medications on file prior to visit.     Objective:     Vitals:   06/24/19 1431  BP: 118/76  Pulse: 86              Physical examination   Pelvic:   Vulva: Normal appearance.  No lesions.  Vagina: No lesions or abnormalities noted.  Support: Normal pelvic support.  Urethra No masses tenderness or scarring.  Meatus Normal size without lesions or prolapse.  Cervix:  Very difficult to identify appears flush with vaginal vault  Anus: Normal exam.  No lesions.  Perineum: Normal exam.  No lesions.     Assessment:    No obstetric history on file. Patient Active Problem List   Diagnosis Date Noted  . Hyperlipidemia associated with type 2 diabetes mellitus ([place]) 06/22/2019  . Microalbuminuria due to type 2 diabetes mellitus ([place]) 06/22/2019  . History of anemia 06/17/2019  . History of vitamin D deficiency 06/17/2019  . Fatigue 06/17/2019  . Bipolar disorder ([place]) 06/17/2019  . Chronic bilateral back pain 06/17/2019  . History of abnormal cervical Papanicolaou smear 06/17/2019  . Diabetes mellitus without complication ([place])   . Depression   . Anxiety      1. 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  I find it unusual that she underwent cryo for an abnormal Pap smear without prior colposcopy but patient seems adamant colposcopy did not  occur.   Plan:            1.  As I have no idea why she had cryo I believe a Pap smear of the upper vagina/cervix can be used to decide if further diagnostic procedures are necessary.  We will wait for results before determining future management. Orders No orders of the defined types were placed in this encounter.   No orders of the defined types were placed in this encounter.     F/U  Return for We will contact her with any abnormal test results. I spent 22 minutes involved in the care of this patient of which greater than 50% was spent discussing previous history of cervical issues, possible diagnoses based on patient's description of treatment, necessity of follow-up and usefulness of Pap smears.  All questions answered.  Finis Bud, M.D. 06/24/2019 4:25 PM

## 2019-06-25 ENCOUNTER — Other Ambulatory Visit (HOSPITAL_COMMUNITY)
Admission: RE | Admit: 2019-06-25 | Discharge: 2019-06-25 | Disposition: A | Discharge status: Home / Self Care | Payer: Medicare Other | Source: Ambulatory Visit | Attending: Obstetrics and Gynecology | Admitting: Obstetrics and Gynecology

## 2019-06-25 DIAGNOSIS — R87619 Unspecified abnormal cytological findings in specimens from cervix uteri: Secondary | ICD-10-CM | POA: Insufficient documentation

## 2019-06-25 DIAGNOSIS — Z1151 Encounter for screening for human papillomavirus (HPV): Secondary | ICD-10-CM | POA: Diagnosis not present

## 2019-06-25 NOTE — Addendum Note (Signed)
Addended by: Durwin Glaze on: 06/25/2019 10:51 AM   Modules accepted: Orders

## 2019-06-29 LAB — CYTOLOGY - PAP
Adequacy: ABSENT
Diagnosis: NEGATIVE
HPV: NOT DETECTED

## 2019-08-09 ENCOUNTER — Other Ambulatory Visit: Payer: Self-pay

## 2019-08-09 ENCOUNTER — Encounter: Payer: Self-pay | Admitting: Family Medicine

## 2019-08-09 ENCOUNTER — Ambulatory Visit (INDEPENDENT_AMBULATORY_CARE_PROVIDER_SITE_OTHER): Payer: Medicare Other | Admitting: Family Medicine

## 2019-08-09 ENCOUNTER — Telehealth: Payer: Self-pay | Admitting: Family Medicine

## 2019-08-09 VITALS — BP 110/72 | HR 92 | Temp 97.9°F | Resp 16 | Ht 61.0 in | Wt 138.3 lb

## 2019-08-09 DIAGNOSIS — F319 Bipolar disorder, unspecified: Secondary | ICD-10-CM | POA: Diagnosis not present

## 2019-08-09 DIAGNOSIS — Z23 Encounter for immunization: Secondary | ICD-10-CM

## 2019-08-09 DIAGNOSIS — E1129 Type 2 diabetes mellitus with other diabetic kidney complication: Secondary | ICD-10-CM

## 2019-08-09 DIAGNOSIS — R809 Proteinuria, unspecified: Secondary | ICD-10-CM | POA: Diagnosis not present

## 2019-08-09 DIAGNOSIS — L2082 Flexural eczema: Secondary | ICD-10-CM | POA: Diagnosis not present

## 2019-08-09 DIAGNOSIS — Z794 Long term (current) use of insulin: Secondary | ICD-10-CM

## 2019-08-09 LAB — POCT CBG (FASTING - GLUCOSE)-MANUAL ENTRY: Glucose Fasting, POC: 229 mg/dL — AB (ref 70–99)

## 2019-08-09 MED ORDER — DULOXETINE HCL 30 MG PO CPEP: ORAL_CAPSULE | ORAL | 1 refills | Status: AC

## 2019-08-09 MED ORDER — HYDROXYZINE HCL 50 MG PO TABS: 50.0000 mg | ORAL_TABLET | Freq: Three times a day (TID) | ORAL | 1 refills | Status: AC | PRN

## 2019-08-09 MED ORDER — TRAZODONE HCL 50 MG PO TABS: ORAL_TABLET | ORAL | 1 refills | Status: DC

## 2019-08-09 MED ORDER — TRIAMCINOLONE ACETONIDE 0.025 % EX CREA: 1.0000 "application " | TOPICAL_CREAM | Freq: Two times a day (BID) | CUTANEOUS | 1 refills | Status: DC

## 2019-08-09 MED ORDER — QUETIAPINE FUMARATE 300 MG PO TABS: 300.0000 mg | ORAL_TABLET | Freq: Every day | ORAL | 1 refills | Status: DC

## 2019-08-09 NOTE — Telephone Encounter (Signed)
That's perfect thank you 

## 2019-08-09 NOTE — Telephone Encounter (Signed)
General 07/26/2019 8:17 AM [facility] - -  Note   Called patient three times,with no return call to schedule. Also reached out three times by email with no response  Thank you for the referral Page

## 2019-08-09 NOTE — Telephone Encounter (Signed)
Can you please provide the contact information to the patient so she can reach out to them?

## 2019-08-09 NOTE — Telephone Encounter (Signed)
I can but I already switched it to:  Peggy Massey

## 2019-08-09 NOTE — Progress Notes (Signed)
Name: Peggy Massey   MRN: [id number]    DOB: 12-23-1960   Date:08/09/2019       Progress Note  Subjective  Chief Complaint  Chief Complaint  Patient presents with  . Follow-up  . Diabetes    HPI  Pt presents for 6 week DM follow up.  Her A1C was 13% when she established care with our clinic 06/17/2019.  She was started on Toujeo and advised to titrate this based on home BG's, and on Weekly Ozempic.  Today she reports BG's have been 88-120 at home.  She is on 20 units Toujeo at this time. She denies hypoglycemic episodes, no polyuria, polydipsia, or polyphagia. We will maintain current regimen.  Bipolar: Was referred to psychiatry, we maintained her medications for 60 days to allow time to be seen.  She has not heard back yet, so we will provide an additional 30 days.  She denies depressive or manic symptoms, feels her mood has been very stable on current regimen.  We will have referrals coordinator check in with psychiatry referral today.  LEFT posterior neck itchiness ongoing intermittently for a few months.  She does have some other areas on the flexural aspect of the bilataeral elbows.  Patient Active Problem List   Diagnosis Date Noted  . Hyperlipidemia associated with type 2 diabetes mellitus ([place]) 06/22/2019  . Microalbuminuria due to type 2 diabetes mellitus ([place]) 06/22/2019  . History of anemia 06/17/2019  . History of vitamin D deficiency 06/17/2019  . Fatigue 06/17/2019  . Bipolar disorder ([place]) 06/17/2019  . Chronic bilateral back pain 06/17/2019  . History of abnormal cervical Papanicolaou smear 06/17/2019  . Diabetes mellitus without complication ([place])   . Depression   . Anxiety     Past Surgical History:  Procedure Laterality Date  . APPENDECTOMY    . BACK SURGERY    . BLADDER REPAIR    . BREAST SURGERY    . CESAREAN SECTION    . NECK SURGERY      Family History  Problem Relation Age of Onset  . Depression Sister     Social History    Socioeconomic History  . Marital status: Divorced    Spouse name: Not on file  . Number of children: 4  . Years of education: Not on file  . Highest education level: 9th grade  Occupational History  . Occupation: disabled  Social Needs  . Financial resource strain: Not hard at all  . Food insecurity    Worry: Never true    Inability: Never true  . Transportation needs    Medical: No    Non-medical: No  Tobacco Use  . Smoking status: Current Every Day Smoker    Packs/day: 0.50    Types: Cigarettes  . Smokeless tobacco: Never Used  Substance and Sexual Activity  . Alcohol use: Not Currently  . Drug use: Yes    Frequency: 4.0 times per week    Types: Marijuana    Comment: "to help relax or for pain."  . Sexual activity: Not Currently  Lifestyle  . Physical activity    Days per week: 0 days    Minutes per session: 0 min  . Stress: Rather much  Relationships  . Social connections    Talks on phone: More than three times a week    Gets together: Never    Attends religious service: Never    Active member of club or organization: No    Attends meetings of clubs or  organizations: Never    Relationship status: Divorced  . Intimate partner violence    Fear of current or ex partner: No    Emotionally abused: No    Physically abused: No    Forced sexual activity: No  Other Topics Concern  . Not on file  Social History Narrative  . Not on file     Current Outpatient Medications:  .  Accu-Chek FastClix Lancets MISC, USE UP TO FOUR TIMES DAILY AS DIRECTED, Disp: 306 each, Rfl: 3 .  atorvastatin (LIPITOR) 40 MG tablet, Take 1 tablet (40 mg total) by mouth daily. **Note dose change**, Disp: 90 tablet, Rfl: 3 .  blood glucose meter kit and supplies KIT, Dispense based on patient and insurance preference. Use up to four times daily as directed. (FOR ICD-9 250.00, 250.01)., Disp: 1 each, Rfl: 0 .  DULoxetine (CYMBALTA) 30 MG capsule, TK ONE C PO QD, Disp: 30 capsule, Rfl: 1 .   hydrOXYzine (ATARAX/VISTARIL) 50 MG tablet, Take 1 tablet (50 mg total) by mouth 3 (three) times daily as needed. for anxiety, Disp: 30 tablet, Rfl: 1 .  Insulin Glargine, 2 Unit Dial, (TOUJEO MAX SOLOSTAR) 300 UNIT/ML SOPN, Inject 12-20 Units into the skin at bedtime., Disp: 9 mL, Rfl: 0 .  Insulin Pen Needle (NOVOFINE) 32G X 6 MM MISC, 1 each by Does not apply route daily., Disp: 100 each, Rfl: 3 .  omeprazole (PRILOSEC) 40 MG capsule, Take 1 capsule (40 mg total) by mouth daily., Disp: 90 capsule, Rfl: 1 .  QUEtiapine (SEROQUEL) 300 MG tablet, Take 1 tablet (300 mg total) by mouth at bedtime., Disp: 30 tablet, Rfl: 1 .  traZODone (DESYREL) 50 MG tablet, TK 1 T PO  QHS, Disp: 30 tablet, Rfl: 1 .  Icosapent Ethyl 1 g CAPS, Take 2 capsules (2 g total) by mouth 2 (two) times daily. (Patient not taking: Reported on 06/24/2019), Disp: 360 capsule, Rfl: 1 .  lisinopril (ZESTRIL) 5 MG tablet, Take 1 tablet (5 mg total) by mouth daily. (Patient not taking: Reported on 06/24/2019), Disp: 90 tablet, Rfl: 3 .  ondansetron (ZOFRAN) 4 MG tablet, Take 1 tablet (4 mg total) by mouth every 8 (eight) hours as needed for nausea or vomiting. (Patient not taking: Reported on 06/24/2019), Disp: 20 tablet, Rfl: 0 .  triamcinolone (KENALOG) 0.025 % cream, Apply 1 application topically 2 (two) times daily., Disp: 30 g, Rfl: 1  No Known Allergies  I personally reviewed active problem list, medication list, allergies, health maintenance, notes from last encounter, lab results with the patient/caregiver today.   ROS Constitutional: Negative for fever or weight change.  Respiratory: Negative for cough and shortness of breath.   Cardiovascular: Negative for chest pain or palpitations.  Gastrointestinal: Negative for abdominal pain, no bowel changes.  Musculoskeletal: Negative for gait problem or joint swelling.  Skin: Negative for rash.  Neurological: Negative for dizziness or headache.  No other specific complaints in a  complete review of systems (except as listed in HPI above).  Objective  Vitals:   08/09/19 1112  BP: 110/72  Pulse: 92  Resp: 16  Temp: 97.9 F (36.6 C)  TempSrc: Temporal  SpO2: 93%  Weight: 138 lb 4.8 oz (62.7 kg)  Height: 5' 1"  (1.549 m)    Body mass index is 26.13 kg/m.  Physical Exam  Constitutional: Patient appears well-developed and well-nourished. No distress.  HENT: Head: Normocephalic and atraumatic. Eyes: Conjunctivae and EOM are normal. No scleral icterus.   Neck: Normal range  of motion. Neck supple. No JVD present.  Cardiovascular: Normal rate, regular rhythm and normal heart sounds.  No murmur heard. No BLE edema. Pulmonary/Chest: Effort normal and breath sounds normal. No respiratory distress. Musculoskeletal: Normal range of motion, no joint effusions. No gross deformities Neurological: Pt is alert and oriented to person, place, and time. No cranial nerve deficit. Coordination, balance, strength, speech and gait are normal.  Skin: Skin is warm and dry. No rash noted. No erythema.  Psychiatric: Patient has a normal mood and affect. behavior is normal. Judgment and thought content normal.  Results for orders placed or performed in visit on 08/09/19 (from the past 72 hour(s))  POCT CBG (Fasting - Glucose)     Status: Abnormal   Collection Time: 08/09/19 11:40 AM  Result Value Ref Range   Glucose Fasting, POC 229 (A) 70 - 99 mg/dL    PHQ2/9: Depression screen [facility] 2/9 08/09/2019 06/17/2019  Decreased Interest 0 3  Down, Depressed, Hopeless 0 3  PHQ - 2 Score 0 6  Altered sleeping 0 3  Tired, decreased energy 0 3  Change in appetite 0 3  Feeling bad or failure about yourself  0 3  Trouble concentrating 0 3  Moving slowly or fidgety/restless 0 0  Suicidal thoughts 0 0  PHQ-9 Score 0 21  Difficult doing work/chores Not difficult at all Very difficult   PHQ-2/9 Result is negative.    Fall Risk: Fall Risk  08/09/2019 06/17/2019  Falls in the past year? 0 0   Number falls in past yr: 0 0  Injury with Fall? 0 0  Follow up Falls evaluation completed Falls evaluation completed   Assessment & Plan  1. Type 2 diabetes mellitus with microalbuminuria, with long-term current use of insulin ([facility]) - Maintain current dosing of 20units daily of Toujeo; Ozempic weekly.   2. Bipolar affective disorder, remission status unspecified ([facility]) - hydrOXYzine (ATARAX/VISTARIL) 50 MG tablet; Take 1 tablet (50 mg total) by mouth 3 (three) times daily as needed. for anxiety  Dispense: 30 tablet; Refill: 1 - QUEtiapine (SEROQUEL) 300 MG tablet; Take 1 tablet (300 mg total) by mouth at bedtime.  Dispense: 30 tablet; Refill: 1 - DULoxetine (CYMBALTA) 30 MG capsule; TK ONE C PO QD  Dispense: 30 capsule; Refill: 1 - traZODone (DESYREL) 50 MG tablet; TK 1 T PO  QHS  Dispense: 30 tablet; Refill: 1  3. Flexural eczema - triamcinolone (KENALOG) 0.025 % cream; Apply 1 application topically 2 (two) times daily.  Dispense: 30 g; Refill: 1

## 2019-08-09 NOTE — Telephone Encounter (Signed)
Peggy Massey - please check on psychiatry referral for this patient, she has not heard back yet.  Please also send to different pain specialist as Dr. Consuela Mimes is not taking new patients at this time.

## 2019-09-03 ENCOUNTER — Telehealth: Payer: Self-pay

## 2019-09-03 NOTE — Telephone Encounter (Signed)
1. Pain Management - she was never referred to a physician that was accepting new patients - Dr. Consuela Mimes not taking new patients.  Please send to alternative specialist if possible. 2. Psychaitry - I believe she wanted to switch - can we send to alternative from prior [facility]?   Thanks!

## 2019-09-03 NOTE — Telephone Encounter (Signed)
Copied from [place] [id number]. Topic: Referral - Status >> Sep 03, 2019 11:37 AM Scherrie Gerlach wrote: Reason for CRM: pt states she has not heard anything from a 1 pain management referral 2. Psychiatric referral  Please look at referral notes and advise on how you want to proceed

## 2019-09-06 NOTE — Telephone Encounter (Signed)
Referral for pain has been sent to [place] and [place].  Referral for psy has been sent to Hildred Priest, MD

## 2019-09-09 ENCOUNTER — Telehealth: Payer: Self-pay

## 2019-09-09 NOTE — Telephone Encounter (Signed)
Copied from [place] [id number]. Topic: Referral - Status >> Sep 09, 2019  9:47 AM Berneta Levins wrote: Reason for CRM:   Mel Almond from [facility] calling.  States they received a referral for this pt - but they do not accept her insurance.  Mel Almond can be reached at [phone]   I checked eligibility on [facility] site and it states that she is covered at the referred to location but when I called to speak to [facility] there was no answer.  A message was left for her to give me a call back so I can get clarity of why this patient cannot be seen there.

## 2019-09-14 ENCOUNTER — Telehealth: Payer: Self-pay | Admitting: Family Medicine

## 2019-09-14 ENCOUNTER — Ambulatory Visit (INDEPENDENT_AMBULATORY_CARE_PROVIDER_SITE_OTHER): Payer: Medicare Other | Admitting: Family Medicine

## 2019-09-14 ENCOUNTER — Other Ambulatory Visit: Payer: Self-pay

## 2019-09-14 ENCOUNTER — Encounter: Payer: Self-pay | Admitting: Family Medicine

## 2019-09-14 VITALS — BP 100/64 | HR 60 | Temp 97.6°F | Resp 14 | Ht 61.0 in | Wt 142.8 lb

## 2019-09-14 DIAGNOSIS — E1169 Type 2 diabetes mellitus with other specified complication: Secondary | ICD-10-CM

## 2019-09-14 DIAGNOSIS — Z0000 Encounter for general adult medical examination without abnormal findings: Secondary | ICD-10-CM

## 2019-09-14 DIAGNOSIS — E119 Type 2 diabetes mellitus without complications: Secondary | ICD-10-CM | POA: Diagnosis not present

## 2019-09-14 DIAGNOSIS — L309 Dermatitis, unspecified: Secondary | ICD-10-CM

## 2019-09-14 DIAGNOSIS — Z23 Encounter for immunization: Secondary | ICD-10-CM

## 2019-09-14 DIAGNOSIS — Z808 Family history of malignant neoplasm of other organs or systems: Secondary | ICD-10-CM

## 2019-09-14 DIAGNOSIS — E785 Hyperlipidemia, unspecified: Secondary | ICD-10-CM

## 2019-09-14 DIAGNOSIS — Z122 Encounter for screening for malignant neoplasm of respiratory organs: Secondary | ICD-10-CM

## 2019-09-14 MED ORDER — TOUJEO MAX SOLOSTAR 300 UNIT/ML ~~LOC~~ SOPN: 12.0000 [IU] | PEN_INJECTOR | Freq: Every day | SUBCUTANEOUS | 3 refills | Status: AC

## 2019-09-14 MED ORDER — OZEMPIC (0.25 OR 0.5 MG/DOSE) 2 MG/1.5ML ~~LOC~~ SOPN: 0.5000 mg | PEN_INJECTOR | SUBCUTANEOUS | Status: AC

## 2019-09-14 MED ORDER — CLOBETASOL PROPIONATE 0.05 % EX SOLN: 1.0000 "application " | Freq: Two times a day (BID) | CUTANEOUS | 0 refills | Status: DC | PRN

## 2019-09-14 NOTE — Telephone Encounter (Signed)
I've changed some things on the referral and will resend it out today but she can call them at [phone]

## 2019-09-14 NOTE — Progress Notes (Signed)
Name: Peggy Massey   MRN: [id number]    DOB: 1961-05-20   Date:09/14/2019       Progress Note  Subjective  Chief Complaint  Chief Complaint  Patient presents with  . Annual Exam    HPI  Patient presents for annual CPE.  Diet: Eating plenty of fresh vegetable; avoiding sugary foods and beverages, but does eat some potatoes. Exercise: Unable to exercise secondary to her chronic back pain; we will check on referral to pain management today.  USPSTF grade A and B recommendations    Office Visit from 09/14/2019 in [facility]  AUDIT-C Score  0     Depression: Phq 9 is  Positive; she is taking her seroquel and cymbalta, notes low libido.  She had psychiatry referral placed in August, but appears she never answered the phone.  Will provide the number for her today.  No SI/HI Depression screen [facility] 2/9 09/14/2019 08/09/2019 06/17/2019  Decreased Interest 2 0 3  Down, Depressed, Hopeless 1 0 3  PHQ - 2 Score 3 0 6  Altered sleeping 0 0 3  Tired, decreased energy 3 0 3  Change in appetite 3 0 3  Feeling bad or failure about yourself  0 0 3  Trouble concentrating 3 0 3  Moving slowly or fidgety/restless 0 0 0  Suicidal thoughts 0 0 0  PHQ-9 Score 12 0 21  Difficult doing work/chores Somewhat difficult Not difficult at all Very difficult   Hypertension: BP Readings from Last 3 Encounters:  09/14/19 100/64  08/09/19 110/72  06/24/19 118/76   Obesity: Wt Readings from Last 3 Encounters:  09/14/19 142 lb 12.8 oz (64.8 kg)  08/09/19 138 lb 4.8 oz (62.7 kg)  06/24/19 137 lb 7 oz (62.3 kg)   BMI Readings from Last 3 Encounters:  09/14/19 26.98 kg/m  08/09/19 26.13 kg/m  06/24/19 25.97 kg/m     Hep C Screening: Negative 06/2019 STD testing and prevention (HIV/chl/gon/syphilis): HIV negative in 06/2019; declines additional testing, no new partners Intimate partner violence: No concerns Sexual History/Pain during Intercourse: No concerns Menstrual  History/LMP/Abnormal Bleeding: No concerns - she is post-menopausal Incontinence Symptoms: No concerns  Breast cancer:  - Last Mammogram: last year, records not available, order is placed for mammogram - BRCA gene screening: No family history  Osteoporosis: discussed high calcium and vitamin D supplementation  Cervical cancer screening: Seeing Dr. Amalia Hailey for history of abnormal Pap  Skin cancer: discussed atypical lesions.  She also notes still struggling with eczema to the scalp on the right posterior aspect and the right forearm.  She would like to see dermatology.  We will also provide alternate topical steroid strength to help with her itching. Colorectal cancer: Denies family or personal history of colorectal cancer, no changes in BM's - no blood in stool, dark and tarry stool, mucus in stool, or constipation/diarrhea.  We need to request records again; she believes she is supposed to follow up from her last colonoscopy in 2022.  Lung cancer: She is smoking 1ppd x35 years  Low Dose CT Chest recommended if Age 96-80 years, 30 pack-year currently smoking OR have quit w/in 15years. Patient does not qualify.   ECG: Denies chest pain, shortness of breath or palpitation.  Advanced Care Planning: A voluntary discussion about advance care planning including the explanation and discussion of advance directives.  Discussed health care proxy and Living will, and the patient was not able to identify a health care proxy..  Patient does not have  a living will at present time. If patient does have living will, I have requested they bring this to the clinic to be scanned in to their chart.  Lipids: Lab Results  Component Value Date   CHOL 259 (H) 06/17/2019   Lab Results  Component Value Date   HDL 38 (L) 06/17/2019   Lab Results  Component Value Date   LDLCALC 159 (H) 06/17/2019   Lab Results  Component Value Date   TRIG 364 (H) 06/17/2019   Lab Results  Component Value Date   CHOLHDL 6.8  (H) 06/17/2019   No results found for: LDLDIRECT  Glucose: Glucose, Bld  Date Value Ref Range Status  06/17/2019 341 (H) 65 - 99 mg/dL Final    Comment:    .            Fasting reference interval . For someone without known diabetes, a glucose value >125 mg/dL indicates that they may have diabetes and this should be confirmed with a follow-up test. .   06/08/2019 414 (H) 70 - 99 mg/dL Final  06/04/2019 308 (H) 70 - 99 mg/dL Final   Glucose-Capillary  Date Value Ref Range Status  06/08/2019 244 (H) 70 - 99 mg/dL Final  06/08/2019 401 (H) 70 - 99 mg/dL Final  06/04/2019 223 (H) 70 - 99 mg/dL Final    Patient Active Problem List   Diagnosis Date Noted  . Hyperlipidemia associated with type 2 diabetes mellitus ([place]) 06/22/2019  . Microalbuminuria due to type 2 diabetes mellitus ([place]) 06/22/2019  . History of anemia 06/17/2019  . History of vitamin D deficiency 06/17/2019  . Fatigue 06/17/2019  . Bipolar disorder ([place]) 06/17/2019  . Chronic bilateral back pain 06/17/2019  . History of abnormal cervical Papanicolaou smear 06/17/2019  . Diabetes mellitus without complication ([place])   . Depression   . Anxiety     Past Surgical History:  Procedure Laterality Date  . APPENDECTOMY    . BACK SURGERY    . BLADDER REPAIR    . BREAST SURGERY    . CESAREAN SECTION    . NECK SURGERY      Family History  Problem Relation Age of Onset  . Depression Sister     Social History   Socioeconomic History  . Marital status: Divorced    Spouse name: Not on file  . Number of children: 4  . Years of education: Not on file  . Highest education level: 9th grade  Occupational History  . Occupation: disabled  Social Needs  . Financial resource strain: Not hard at all  . Food insecurity    Worry: Never true    Inability: Never true  . Transportation needs    Medical: No    Non-medical: No  Tobacco Use  . Smoking status: Current Every Day Smoker    Packs/day: 0.50    Types:  Cigarettes  . Smokeless tobacco: Never Used  Substance and Sexual Activity  . Alcohol use: Not Currently  . Drug use: Yes    Frequency: 4.0 times per week    Types: Marijuana    Comment: "to help relax or for pain."  . Sexual activity: Not Currently  Lifestyle  . Physical activity    Days per week: 0 days    Minutes per session: 0 min  . Stress: Rather much  Relationships  . Social connections    Talks on phone: More than three times a week    Gets together: Never    Attends  religious service: Never    Active member of club or organization: No    Attends meetings of clubs or organizations: Never    Relationship status: Divorced  . Intimate partner violence    Fear of current or ex partner: No    Emotionally abused: No    Physically abused: No    Forced sexual activity: No  Other Topics Concern  . Not on file  Social History Narrative  . Not on file     Current Outpatient Medications:  .  Accu-Chek FastClix Lancets MISC, USE UP TO FOUR TIMES DAILY AS DIRECTED, Disp: 306 each, Rfl: 3 .  atorvastatin (LIPITOR) 40 MG tablet, Take 1 tablet (40 mg total) by mouth daily. **Note dose change**, Disp: 90 tablet, Rfl: 3 .  blood glucose meter kit and supplies KIT, Dispense based on patient and insurance preference. Use up to four times daily as directed. (FOR ICD-9 250.00, 250.01)., Disp: 1 each, Rfl: 0 .  DULoxetine (CYMBALTA) 30 MG capsule, TK ONE C PO QD, Disp: 30 capsule, Rfl: 1 .  hydrOXYzine (ATARAX/VISTARIL) 50 MG tablet, Take 1 tablet (50 mg total) by mouth 3 (three) times daily as needed. for anxiety, Disp: 30 tablet, Rfl: 1 .  Insulin Glargine, 2 Unit Dial, (TOUJEO MAX SOLOSTAR) 300 UNIT/ML SOPN, Inject 12-20 Units into the skin at bedtime., Disp: 9 mL, Rfl: 0 .  Insulin Pen Needle (NOVOFINE) 32G X 6 MM MISC, 1 each by Does not apply route daily., Disp: 100 each, Rfl: 3 .  omeprazole (PRILOSEC) 40 MG capsule, Take 1 capsule (40 mg total) by mouth daily., Disp: 90 capsule,  Rfl: 1 .  QUEtiapine (SEROQUEL) 300 MG tablet, Take 1 tablet (300 mg total) by mouth at bedtime., Disp: 30 tablet, Rfl: 1 .  traZODone (DESYREL) 50 MG tablet, TK 1 T PO  QHS, Disp: 30 tablet, Rfl: 1 .  triamcinolone (KENALOG) 0.025 % cream, Apply 1 application topically 2 (two) times daily., Disp: 30 g, Rfl: 1 .  Icosapent Ethyl 1 g CAPS, Take 2 capsules (2 g total) by mouth 2 (two) times daily. (Patient not taking: Reported on 06/24/2019), Disp: 360 capsule, Rfl: 1 .  lisinopril (ZESTRIL) 5 MG tablet, Take 1 tablet (5 mg total) by mouth daily. (Patient not taking: Reported on 06/24/2019), Disp: 90 tablet, Rfl: 3 .  ondansetron (ZOFRAN) 4 MG tablet, Take 1 tablet (4 mg total) by mouth every 8 (eight) hours as needed for nausea or vomiting. (Patient not taking: Reported on 06/24/2019), Disp: 20 tablet, Rfl: 0  No Known Allergies   ROS  Constitutional: Negative for fever or weight change.  Respiratory: Negative for cough and shortness of breath.   Cardiovascular: Negative for chest pain or palpitations.  Gastrointestinal: Negative for abdominal pain, no bowel changes.  Musculoskeletal: Negative for gait problem or joint swelling.  Skin: See HPI Neurological: Negative for dizziness or headache.  No other specific complaints in a complete review of systems (except as listed in HPI above).  Objective  Vitals:   09/14/19 0926  BP: 100/64  Pulse: 60  Resp: 14  Temp: 97.6 F (36.4 C)  TempSrc: Temporal  SpO2: 99%  Weight: 142 lb 12.8 oz (64.8 kg)  Height: 5' 1"  (1.549 m)    Body mass index is 26.98 kg/m.  Physical Exam  Constitutional: Patient appears well-developed and well-nourished. No distress.  HENT: Head: Normocephalic and atraumatic. Ears: B TMs ok, no erythema or effusion; Nose: Nose normal. Mouth/Throat: Oropharynx is clear and moist.  No oropharyngeal exudate.  Eyes: Conjunctivae and EOM are normal. Pupils are equal, round, and reactive to light. No scleral icterus.  Neck:  Normal range of motion. Neck supple. No JVD present. No thyromegaly present.  Cardiovascular: Normal rate, regular rhythm and normal heart sounds.  No murmur heard. No BLE edema. Pulmonary/Chest: Effort normal and breath sounds normal. No respiratory distress. Abdominal: Soft. Bowel sounds are normal, no distension. There is no tenderness. no masses Breast: no lumps or masses, no nipple discharge or rashes FEMALE GENITALIA: Deferred Musculoskeletal: Normal range of motion, no joint effusions. No gross deformities Neurological: he is alert and oriented to person, place, and time. No cranial nerve deficit. Coordination, balance, strength, speech and gait are normal.  Skin: Skin is warm and dry. There is some mild scaling and erythema to the left posterior scalp in 3 small quarter-sized patches.  The right forearm has small papular erythematous lesions with excoriation.  Psychiatric: Patient has a normal mood and affect. behavior is normal. Judgment and thought content normal.   Recent Results (from the past 2160 hour(s))  POCT glycosylated hemoglobin (Hb A1C)     Status: Abnormal   Collection Time: 06/17/19 10:57 AM  Result Value Ref Range   Hemoglobin A1C     HbA1c POC (<> result, manual entry)     HbA1c, POC (prediabetic range)     HbA1c, POC (controlled diabetic range) 13.0 (A) 0.0 - 7.0 %  Hepatitis C antibody     Status: None   Collection Time: 06/17/19 11:01 AM  Result Value Ref Range   Hepatitis C Ab NON-REACTIVE NON-REACTI   SIGNAL TO CUT-OFF 0.01 <1.00    Comment: . HCV antibody was non-reactive. There is no laboratory  evidence of HCV infection. . In most cases, no further action is required. However, if recent HCV exposure is suspected, a test for HCV RNA (test code 8784451523) is suggested. . For additional information please refer to http://education.[facility].com/faq/FAQ22v1 (This link is being provided for informational/ educational purposes only.) .   Lipid  panel     Status: Abnormal   Collection Time: 06/17/19 11:01 AM  Result Value Ref Range   Cholesterol 259 (H) <200 mg/dL   HDL 38 (L) > OR = 50 mg/dL   Triglycerides 364 (H) <150 mg/dL    Comment: . If a non-fasting specimen was collected, consider repeat triglyceride testing on a fasting specimen if clinically indicated.  Yates Decamp et al. J. of Clin. Lipidol. [id number];2:505-397. Marland Kitchen    LDL Cholesterol (Calc) 159 (H) mg/dL (calc)    Comment: Reference range: <100 . Desirable range <100 mg/dL for primary prevention;   <70 mg/dL for patients with CHD or diabetic patients  with > or = 2 CHD risk factors. Marland Kitchen LDL-C is now calculated using the Martin-Hopkins  calculation, which is a validated novel method providing  better accuracy than the Friedewald equation in the  estimation of LDL-C.  Cresenciano Genre et al. Annamaria Helling. 6734;193(79): 2061-2068  (http://education.[facility].com/faq/FAQ164)    Total CHOL/HDL Ratio 6.8 (H) <5.0 (calc)   Non-HDL Cholesterol (Calc) 221 (H) <130 mg/dL (calc)    Comment: Non-HDL level > or = 220 is very high and may indicate  genetic familial hypercholesterolemia (FH). Clinical  assessment and measurement of blood lipid levels  should be considered for all first-degree relatives  of patients with an FH diagnosis. . For patients with diabetes plus 1 major ASCVD risk  factor, treating to a non-HDL-C goal of <100 mg/dL  (LDL-C of <70 mg/dL) is  considered a therapeutic  option.   HIV Antibody (routine testing w rflx)     Status: None   Collection Time: 06/17/19 11:01 AM  Result Value Ref Range   HIV 1&2 Ab, 4th Generation NON-REACTIVE NON-REACTI    Comment: HIV-1 antigen and HIV-1/HIV-2 antibodies were not detected. There is no laboratory evidence of HIV infection. Marland Kitchen PLEASE NOTE: This information has been disclosed to you from records whose confidentiality may be protected by state law.  If your state requires such protection, then the state law prohibits you  from making any further disclosure of the information without the specific written consent of the person to whom it pertains, or as otherwise permitted by law. A general authorization for the release of medical or other information is NOT sufficient for this purpose. . For additional information please refer to http://education.[facility].com/faq/FAQ106 (This link is being provided for informational/ educational purposes only.) . Marland Kitchen The performance of this assay has not been clinically validated in patients less than 48 years old. .   COMPLETE METABOLIC PANEL WITH GFR     Status: Abnormal   Collection Time: 06/17/19 11:01 AM  Result Value Ref Range   Glucose, Bld 341 (H) 65 - 99 mg/dL    Comment: .            Fasting reference interval . For someone without known diabetes, a glucose value >125 mg/dL indicates that they may have diabetes and this should be confirmed with a follow-up test. .    BUN 13 7 - 25 mg/dL   Creat 0.70 0.50 - 1.05 mg/dL    Comment: For patients >26 years of age, the reference limit for Creatinine is approximately 13% higher for people identified as African-American. .    GFR, Est Non African American 95 > OR = 60 mL/min/1.31m   GFR, Est African American 110 > OR = 60 mL/min/1.728m  BUN/Creatinine Ratio NOT APPLICABLE 6 - 22 (calc)   Sodium 135 135 - 146 mmol/L   Potassium 4.8 3.5 - 5.3 mmol/L   Chloride 95 (L) 98 - 110 mmol/L   CO2 29 20 - 32 mmol/L   Calcium 10.7 (H) 8.6 - 10.4 mg/dL   Total Protein 8.1 6.1 - 8.1 g/dL   Albumin 4.9 3.6 - 5.1 g/dL   Globulin 3.2 1.9 - 3.7 g/dL (calc)   AG Ratio 1.5 1.0 - 2.5 (calc)   Total Bilirubin 0.4 0.2 - 1.2 mg/dL   Alkaline phosphatase (APISO) 140 37 - 153 U/L   AST 18 10 - 35 U/L   ALT 28 6 - 29 U/L  CBC with Differential/Platelet     Status: Abnormal   Collection Time: 06/17/19 11:01 AM  Result Value Ref Range   WBC 9.1 3.8 - 10.8 Thousand/uL   RBC 5.79 (H) 3.80 - 5.10 Million/uL    Hemoglobin 15.4 11.7 - 15.5 g/dL   HCT 48.2 (H) 35.0 - 45.0 %   MCV 83.2 80.0 - 100.0 fL   MCH 26.6 (L) 27.0 - 33.0 pg   MCHC 32.0 32.0 - 36.0 g/dL   RDW 13.0 11.0 - 15.0 %   Platelets 269 140 - 400 Thousand/uL   MPV 11.6 7.5 - 12.5 fL   Neutro Abs 5,096 1,500 - 7,800 cells/uL   Lymphs Abs 3,331 850 - 3,900 cells/uL   Absolute Monocytes 491 200 - 950 cells/uL   Eosinophils Absolute 118 15 - 500 cells/uL   Basophils Absolute 64 0 - 200 cells/uL   Neutrophils  Relative % 56 %   Total Lymphocyte 36.6 %   Monocytes Relative 5.4 %   Eosinophils Relative 1.3 %   Basophils Relative 0.7 %  Urine Microalbumin w/creat. ratio     Status: Abnormal   Collection Time: 06/17/19 11:01 AM  Result Value Ref Range   Creatinine, Urine 46 20 - 275 mg/dL   Microalb, Ur 2.5 mg/dL    Comment: Reference Range Not established    Microalb Creat Ratio 54 (H) <30 mcg/mg creat    Comment: . The ADA defines abnormalities in albumin excretion as follows: Marland Kitchen Category         Result (mcg/mg creatinine) . Normal                    <30 Microalbuminuria         30-299  Clinical albuminuria   > OR = 300 . The ADA recommends that at least two of three specimens collected within a 3-6 month period be abnormal before considering a patient to be within a diagnostic category.   VITAMIN D 25 Hydroxy (Vit-D Deficiency, Fractures)     Status: Abnormal   Collection Time: 06/17/19 11:01 AM  Result Value Ref Range   Vit D, 25-Hydroxy 26 (L) 30 - 100 ng/mL    Comment: Vitamin D Status         25-OH Vitamin D: . Deficiency:                    <20 ng/mL Insufficiency:             20 - 29 ng/mL Optimal:                 > or = 30 ng/mL . For 25-OH Vitamin D testing on patients on  D2-supplementation and patients for whom quantitation  of D2 and D3 fractions is required, the QuestAssureD(TM) 25-OH VIT D, (D2,D3), LC/MS/MS is recommended: order  code [id number] (patients >59yr). See Note 1 . Note 1 . For additional  information, please refer to  http://education.[facility].com/faq/FAQ199  (This link is being provided for informational/ educational purposes only.)   TSH     Status: None   Collection Time: 06/17/19 11:01 AM  Result Value Ref Range   TSH 1.08 0.40 - 4.50 mIU/L  Cytology - PAP     Status: None   Collection Time: 06/25/19 12:00 AM  Result Value Ref Range   Adequacy      Satisfactory for evaluation  endocervical/transformation zone component ABSENT.   Diagnosis      NEGATIVE FOR INTRAEPITHELIAL LESIONS OR MALIGNANCY.   HPV NOT Detected     Comment: Normal Reference Range - NOT Detected   Material Submitted CervicoVaginal Pap [ThinPrep Imaged]   POCT CBG (Fasting - Glucose)     Status: Abnormal   Collection Time: 08/09/19 11:40 AM  Result Value Ref Range   Glucose Fasting, POC 229 (A) 70 - 99 mg/dL     Fall Risk: Fall Risk  09/14/2019 08/09/2019 06/17/2019  Falls in the past year? 0 0 0  Number falls in past yr: 0 0 0  Injury with Fall? 0 0 0  Follow up Falls evaluation completed Falls evaluation completed Falls evaluation completed     Assessment & Plan  1. Well woman exam (no gynecological exam) -USPSTF grade A and B recommendations reviewed with patient; age-appropriate recommendations, preventive care, screening tests, etc discussed and encouraged; healthy living encouraged; see AVS for patient education given to  patient -Discussed importance of 150 minutes of physical activity weekly, eat two servings of fish weekly, eat one serving of tree nuts ( cashews, pistachios, pecans, almonds.Marland Kitchen) every other day, eat 6 servings of fruit/vegetables daily and drink plenty of water and avoid sweet beverages.   - Ambulatory referral to Dermatology - CT CHEST LUNG CA SCREEN LOW DOSE W/O CM; Future  2. Diabetes mellitus without complication (HCC) - Insulin Glargine, 2 Unit Dial, (TOUJEO MAX SOLOSTAR) 300 UNIT/ML SOPN; Inject 12-20 Units into the skin at bedtime.  Dispense: 9 mL;  Refill: 3 - COMPLETE METABOLIC PANEL WITH GFR - Hemoglobin A1c  3. Eczema, unspecified type - Ambulatory referral to Dermatology - clobetasol (TEMOVATE) 0.05 % external solution; Apply 1 application topically 2 (two) times daily as needed.  Dispense: 50 mL; Refill: 0  4. Family history of skin cancer - Ambulatory referral to Dermatology  5. Encounter for screening for malignant neoplasm of respiratory organs - CT CHEST LUNG CA SCREEN LOW DOSE W/O CM; Future  6. Encounter for screening for lung cancer - CT CHEST LUNG CA SCREEN LOW DOSE W/O CM; Future  7. Need for Tdap vaccination - Tdap vaccine greater than or equal to 7yo IM  8. Hyperlipidemia associated with type 2 diabetes mellitus (HCC) - Lipid panel

## 2019-09-14 NOTE — Patient Instructions (Addendum)
Please call Peggy Priest, MD for psychiatry appointment ASAP.  They have tried reaching out to you several times with no response.   Please call - [phone]   Preventive Care 64-59 Years Old, Female Old, Female Preventive care refers to visits with your health care provider and lifestyle choices that can promote health and wellness. This includes:  A yearly physical exam. This may also be called an annual well check.   Regular dental visits and eye exams.  Immunizations.  Screening for certain conditions.  Healthy lifestyle choices, such as eating a healthy diet, getting regular exercise, not using drugs or products that contain nicotine and tobacco, and limiting alcohol use. What can I expect for my preventive care visit? Physical exam Your health care provider will check your:  Height and weight. This may be used to calculate body mass index (BMI), which tells if you are at a healthy weight.  Heart rate and blood pressure.  Skin for abnormal spots. Counseling Your health care provider may ask you questions about your:  Alcohol, tobacco, and drug use.  Emotional well-being.  Home and relationship well-being.  Sexual activity.  Eating habits.  Work and work Statistician.  Method of birth control.  Menstrual cycle.  Pregnancy history. What immunizations do I need?  Influenza (flu) vaccine  This is recommended every year. Tetanus, diphtheria, and pertussis (Tdap) vaccine  You may need a Td booster every 10 years. Varicella (chickenpox) vaccine  You may need this if you have not been vaccinated. Zoster (shingles) vaccine  You may need this after age 90. Measles, mumps, and rubella (MMR) vaccine  You may need at least one dose of MMR if you were born in 1957 or later. You may also need a second dose. Pneumococcal conjugate (PCV13) vaccine  You may need this if you have certain conditions and were not previously vaccinated. Pneumococcal polysaccharide  (PPSV23) vaccine  You may need one or two doses if you smoke cigarettes or if you have certain conditions. Meningococcal conjugate (MenACWY) vaccine  You may need this if you have certain conditions. Hepatitis A vaccine  You may need this if you have certain conditions or if you travel or work in places where you may be exposed to hepatitis A. Hepatitis B vaccine  You may need this if you have certain conditions or if you travel or work in places where you may be exposed to hepatitis B. Haemophilus influenzae type b (Hib) vaccine  You may need this if you have certain conditions. Human papillomavirus (HPV) vaccine  If recommended by your health care provider, you may need three doses over 6 months. You may receive vaccines as individual doses or as more than one vaccine together in one shot (combination vaccines). Talk with your health care provider about the risks and benefits of combination vaccines. What tests do I need? Blood tests  Lipid and cholesterol levels. These may be checked every 5 years, or more frequently if you are over 89 years old.  Hepatitis C test.  Hepatitis B test. Screening  Lung cancer screening. You may have this screening every year starting at age 83 if you have a 30-pack-year history of smoking and currently smoke or have quit within the past 15 years.  Colorectal cancer screening. All adults should have this screening starting at age 34 and continuing until age 11. Your health care provider may recommend screening at age 81 if you are at increased risk. You will have tests every 1-10 years, depending on your results  and the type of screening test.  Diabetes screening. This is done by checking your blood sugar (glucose) after you have not eaten for a while (fasting). You may have this done every 1-3 years.  Mammogram. This may be done every 1-2 years. Talk with your health care provider about when you should start having regular mammograms. This may  depend on whether you have a family history of breast cancer.  BRCA-related cancer screening. This may be done if you have a family history of breast, ovarian, tubal, or peritoneal cancers.  Pelvic exam and Pap test. This may be done every 3 years starting at age 9. Starting at age 22, this may be done every 5 years if you have a Pap test in combination with an HPV test. Other tests  Sexually transmitted disease (STD) testing.  Bone density scan. This is done to screen for osteoporosis. You may have this scan if you are at high risk for osteoporosis. Follow these instructions at home: Eating and drinking  Eat a diet that includes fresh fruits and vegetables, whole grains, lean protein, and low-fat dairy.  Take vitamin and mineral supplements as recommended by your health care provider.  Do not drink alcohol if: ? Your health care provider tells you not to drink. ? You are pregnant, may be pregnant, or are planning to become pregnant.  If you drink alcohol: ? Limit how much you have to 0-1 drink a day. ? Be aware of how much alcohol is in your drink. In the [place] one drink equals one 12 oz bottle of beer (355 mL), one 5 oz glass of wine (148 mL), or one 1 oz glass of hard liquor (44 mL). Lifestyle  Take daily care of your teeth and gums.  Stay active. Exercise for at least 30 minutes on 5 or more days each week.  Do not use any products that contain nicotine or tobacco, such as cigarettes, e-cigarettes, and chewing tobacco. If you need help quitting, ask your health care provider.  If you are sexually active, practice safe sex. Use a condom or other form of birth control (contraception) in order to prevent pregnancy and STIs (sexually transmitted infections).  If told by your health care provider, take low-dose aspirin daily starting at age 62. What's next?  Visit your health care provider once a year for a well check visit.  Ask your health care provider how often you should  have your eyes and teeth checked.  Stay up to date on all vaccines. This information is not intended to replace advice given to you by your health care provider. Make sure you discuss any questions you have with your health care provider. Document Released: 11/17/2015 Document Revised: 07/02/2018 Document Reviewed: 07/02/2018 Elsevier Patient Education  2020 [employer].

## 2019-09-14 NOTE — Telephone Encounter (Signed)
Peggy Massey - please provide pain management phone number to the patient per [place].

## 2019-09-14 NOTE — Telephone Encounter (Signed)
Please check on pain management referral - pt has still not heard back regarding this.

## 2019-09-16 ENCOUNTER — Other Ambulatory Visit: Payer: Self-pay | Admitting: Family Medicine

## 2019-09-17 NOTE — Telephone Encounter (Signed)
Patient notified

## 2019-09-20 LAB — VITAMIN D 25 HYDROXY (VIT D DEFICIENCY, FRACTURES): Vit D, 25-Hydroxy: 27 ng/mL — ABNORMAL LOW (ref 30–100)

## 2019-09-20 LAB — HEMOGLOBIN A1C
Hgb A1c MFr Bld: 7.9 % of total Hgb — ABNORMAL HIGH (ref <5.7)
Mean Plasma Glucose: 180 (calc)
eAG (mmol/L): 10 (calc)

## 2019-09-20 LAB — COMPLETE METABOLIC PANEL WITH GFR
AG Ratio: 1.7 (calc) (ref 1.0–2.5)
ALT: 43 U/L — ABNORMAL HIGH (ref 6–29)
AST: 21 U/L (ref 10–35)
Albumin: 4.5 g/dL (ref 3.6–5.1)
Alkaline phosphatase (APISO): 123 U/L (ref 37–153)
BUN: 13 mg/dL (ref 7–25)
CO2: 33 mmol/L — ABNORMAL HIGH (ref 20–32)
Calcium: 10.6 mg/dL — ABNORMAL HIGH (ref 8.6–10.4)
Chloride: 101 mmol/L (ref 98–110)
Creat: 0.76 mg/dL (ref 0.50–1.05)
GFR, Est African American: 100 mL/min/{1.73_m2} (ref >=60)
GFR, Est Non African American: 86 mL/min/{1.73_m2} (ref >=60)
Globulin: 2.7 g/dL (calc) (ref 1.9–3.7)
Glucose, Bld: 96 mg/dL (ref 65–99)
Potassium: 5.1 mmol/L (ref 3.5–5.3)
Sodium: 140 mmol/L (ref 135–146)
Total Bilirubin: 0.5 mg/dL (ref 0.2–1.2)
Total Protein: 7.2 g/dL (ref 6.1–8.1)

## 2019-09-20 LAB — TEST AUTHORIZATION

## 2019-09-20 LAB — PTH, INTACT AND CALCIUM
Calcium: 10.8 mg/dL — ABNORMAL HIGH (ref 8.6–10.4)
PTH: 13 pg/mL — ABNORMAL LOW (ref 14–64)

## 2019-09-20 LAB — LIPID PANEL
Cholesterol: 136 mg/dL (ref <200)
HDL: 50 mg/dL (ref >=50)
LDL Cholesterol (Calc): 63 mg/dL (calc)
Non-HDL Cholesterol (Calc): 86 mg/dL (calc) (ref <130)
Total CHOL/HDL Ratio: 2.7 (calc) (ref <5.0)
Triglycerides: 148 mg/dL (ref <150)

## 2019-09-22 ENCOUNTER — Telehealth: Payer: Self-pay | Admitting: *Deleted

## 2019-09-22 DIAGNOSIS — Z122 Encounter for screening for malignant neoplasm of respiratory organs: Secondary | ICD-10-CM

## 2019-09-22 DIAGNOSIS — Z87891 Personal history of nicotine dependence: Secondary | ICD-10-CM

## 2019-09-22 NOTE — Telephone Encounter (Signed)
Received referral for initial lung cancer screening scan. Contacted patient and obtained smoking history,(current, 34 pack year) as well as answering questions related to screening process. Patient denies signs of lung cancer such as weight loss or hemoptysis. Patient denies comorbidity that would prevent curative treatment if lung cancer were found. Patient is scheduled for shared decision making visit and CT scan on 10/12/19 at 130pm.

## 2019-09-23 ENCOUNTER — Encounter: Payer: Self-pay | Admitting: Family Medicine

## 2019-10-11 ENCOUNTER — Other Ambulatory Visit: Payer: Self-pay | Admitting: Family Medicine

## 2019-10-11 DIAGNOSIS — F319 Bipolar disorder, unspecified: Secondary | ICD-10-CM

## 2019-10-11 MED ORDER — TRAZODONE HCL 50 MG PO TABS: ORAL_TABLET | ORAL | 0 refills | Status: DC

## 2019-10-11 NOTE — Telephone Encounter (Signed)
Order sent to Emily 

## 2019-10-11 NOTE — Telephone Encounter (Signed)
#  30 refilled; she needs to schedule with psychiatry - she was given the number at last visit.  Please make sure she has upcoming appointment scheduled.

## 2019-10-11 NOTE — Telephone Encounter (Signed)
Pt request refill  traZODone (DESYREL) 50 MG tablet  [id number] [address], [address] [phone] (Phone) [phone] (Fax)

## 2019-10-12 ENCOUNTER — Ambulatory Visit: Payer: Medicare Other

## 2019-10-12 ENCOUNTER — Inpatient Hospital Stay: Payer: Medicare Other | Admitting: Oncology

## 2019-10-22 ENCOUNTER — Other Ambulatory Visit: Payer: Self-pay | Admitting: Family Medicine

## 2019-10-22 DIAGNOSIS — F319 Bipolar disorder, unspecified: Secondary | ICD-10-CM

## 2019-10-22 NOTE — Telephone Encounter (Signed)
Requested medication (s) are due for refill today:yes  Requested medication (s) are on the active medication list: yes  Last refill:  09/15/2019  Future visit scheduled:No  Notes to clinic:  refill cannot be delegated    Requested Prescriptions  Pending Prescriptions Disp Refills   QUEtiapine (SEROQUEL) 300 MG tablet [Pharmacy Med Name: QUETIAPINE 300MG  TABLETS] 30 tablet 1    Sig: TAKE 1 TABLET BY MOUTH EVERY NIGHT AT BEDTIME      Not Delegated - Psychiatry:  Antipsychotics - Second Generation (Atypical) - quetiapine Failed - 10/22/2019 11:18 AM      Failed - This refill cannot be delegated      Failed - ALT in normal range and within 180 days    ALT  Date Value Ref Range Status  09/14/2019 43 (H) 6 - 29 U/L Final          Passed - AST in normal range and within 180 days    AST  Date Value Ref Range Status  09/14/2019 21 10 - 35 U/L Final          Passed - Completed PHQ-2 or PHQ-9 in the last 360 days.      Passed - Last BP in normal range    BP Readings from Last 1 Encounters:  09/14/19 100/64          Passed - Valid encounter within last 6 months    Recent Outpatient Visits           1 month ago Well woman exam (no gynecological exam)   [place], FNP   2 months ago Type 2 diabetes mellitus with microalbuminuria, with long-term current use of insulin [facility])   [place], [place], FNP   4 months ago Diabetes mellitus without complication [facility])   [place], [place]               Signed Prescriptions Disp Refills   traZODone (DESYREL) 50 MG tablet 30 tablet 0    Sig: TAKE 1 TABLET BY MOUTH EVERY NIGHT AT BEDTIME      Psychiatry: Antidepressants - Serotonin Modulator Passed - 10/22/2019 11:18 AM      Passed - Completed PHQ-2 or PHQ-9 in the last 360 days.      Passed - Valid encounter within last 6 months    Recent Outpatient Visits           1 month  ago Well woman exam (no gynecological exam)   [place], FNP   2 months ago Type 2 diabetes mellitus with microalbuminuria, with long-term current use of insulin [facility])   [place], FNP   4 months ago Diabetes mellitus without complication [facility])   [place], [place], [place]

## 2019-10-24 NOTE — Telephone Encounter (Signed)
Peggy Massey - please check in with patient - I cannot continue her seroquel Rx - I am sending in 30 day supply, then she MUST obtain from psychiatry as she was referred and according to referral notes was seen in August 2020.

## 2019-11-26 ENCOUNTER — Other Ambulatory Visit: Payer: Self-pay | Admitting: Family Medicine

## 2019-11-26 DIAGNOSIS — F319 Bipolar disorder, unspecified: Secondary | ICD-10-CM

## 2019-11-26 NOTE — Telephone Encounter (Signed)
Needs to follow up with psychiatry for any additional refills after today.

## 2019-12-28 ENCOUNTER — Other Ambulatory Visit: Payer: Self-pay | Admitting: Family Medicine

## 2019-12-28 DIAGNOSIS — F319 Bipolar disorder, unspecified: Secondary | ICD-10-CM

## 2019-12-28 DIAGNOSIS — E119 Type 2 diabetes mellitus without complications: Secondary | ICD-10-CM

## 2020-02-28 ENCOUNTER — Other Ambulatory Visit: Payer: Self-pay | Admitting: Family Medicine

## 2020-02-28 DIAGNOSIS — L309 Dermatitis, unspecified: Secondary | ICD-10-CM

## 2020-02-28 NOTE — Telephone Encounter (Signed)
Requested Prescriptions  Pending Prescriptions Disp Refills  . clobetasol (TEMOVATE) 0.05 % external solution [Pharmacy Med Name: CLOBETASOL PROP 0.05% SOL 50ML] 50 mL 0    Sig: APPLY EXTERNALLY TO THE AFFECTED AREA TWICE DAILY AS NEEDED     Dermatology:  Corticosteroids Passed - 02/28/2020  8:35 PM      Passed - Valid encounter within last 12 months    Recent Outpatient Visits          5 months ago Well woman exam (no gynecological exam)   [facility] Doren Custard, FNP   6 months ago Type 2 diabetes mellitus with microalbuminuria, with long-term current use of insulin [facility])   [facility] Doren Custard, FNP   8 months ago Diabetes mellitus without complication [facility])   [facility] [place], [place]ome [place], [place]             . ACCU-CHEK GUIDE test strip [Pharmacy Med Name: ACCU-CHEK GUIDE TEST STRIPS 100S] 100 strip 4    Sig: USE UP TO FOUR TIMES DAILY AS DIRECTED     Endocrinology: Diabetes - Testing Supplies Passed - 02/28/2020  8:35 PM      Passed - Valid encounter within last 12 months    Recent Outpatient Visits          5 months ago Well woman exam (no gynecological exam)   [facility] Doren Custard, FNP   6 months ago Type 2 diabetes mellitus with microalbuminuria, with long-term current use of insulin [facility])   [facility] Doren Custard, FNP   8 months ago Diabetes mellitus without complication [facility])   [facility] [place], [place], [place]

## 2021-07-04 ENCOUNTER — Telehealth: Payer: Self-pay

## 2021-07-05 NOTE — Telephone Encounter (Signed)
Erroneous encounter
